# Patient Record
Sex: Male | Born: 1950 | Race: White | Hispanic: No | Marital: Married
Health system: Southern US, Community
[De-identification: ages and names within clinical notes are randomized; demographics above are authoritative.]

## PROBLEM LIST (undated history)

## (undated) DIAGNOSIS — Z9049 Acquired absence of other specified parts of digestive tract: Secondary | ICD-10-CM

## (undated) DIAGNOSIS — A498 Other bacterial infections of unspecified site: Secondary | ICD-10-CM

## (undated) HISTORY — PX: OTHER SURGICAL HISTORY: SHX169

## (undated) HISTORY — DX: Acquired absence of other specified parts of digestive tract: Z90.49

## (undated) HISTORY — PX: GASTRIC BYPASS: SHX52

## (undated) HISTORY — DX: Other bacterial infections of unspecified site: A49.8

---

## 2004-08-03 ENCOUNTER — Ambulatory Visit: Payer: Self-pay | Admitting: Internal Medicine

## 2004-09-27 ENCOUNTER — Ambulatory Visit: Payer: Self-pay | Admitting: Gastroenterology

## 2004-11-17 ENCOUNTER — Ambulatory Visit: Payer: Self-pay | Admitting: Internal Medicine

## 2004-11-29 ENCOUNTER — Ambulatory Visit: Payer: Self-pay | Admitting: Urology

## 2004-12-01 ENCOUNTER — Ambulatory Visit: Payer: Self-pay | Admitting: Urology

## 2009-03-17 DEATH — deceased

## 2010-05-26 ENCOUNTER — Ambulatory Visit: Payer: Self-pay | Admitting: Gastroenterology

## 2012-04-16 ENCOUNTER — Ambulatory Visit: Payer: Self-pay | Admitting: Urology

## 2012-04-22 ENCOUNTER — Ambulatory Visit: Payer: Self-pay | Admitting: Urology

## 2012-04-24 ENCOUNTER — Ambulatory Visit: Payer: Self-pay | Admitting: Urology

## 2012-04-25 ENCOUNTER — Ambulatory Visit: Payer: Self-pay | Admitting: Urology

## 2012-05-16 ENCOUNTER — Ambulatory Visit: Payer: Self-pay | Admitting: Urology

## 2012-05-30 ENCOUNTER — Ambulatory Visit: Payer: Self-pay | Admitting: Urology

## 2012-06-06 ENCOUNTER — Ambulatory Visit: Payer: Self-pay | Admitting: Urology

## 2012-06-10 ENCOUNTER — Ambulatory Visit: Payer: Self-pay | Admitting: Urology

## 2013-09-04 ENCOUNTER — Other Ambulatory Visit: Payer: Self-pay | Admitting: Urology

## 2013-09-04 DIAGNOSIS — N209 Urinary calculus, unspecified: Secondary | ICD-10-CM

## 2014-08-07 NOTE — Op Note (Signed)
PATIENT NAME:  Nathan [name], Nathan [name] MR#:  [id number] DATE OF BIRTH:  1951/02/25  DATE OF PROCEDURE:  04/22/2012  PREOPERATIVE DIAGNOSIS: Bilateral nephrolithiasis.   POSTOPERATIVE DIAGNOSIS: Bilateral nephrolithiasis.   PROCEDURES: 1. Cystoscopy with bilateral stent placement.  2. Fluoroscopy.   SURGEON: Anola GurneyMichael Alexey Rhoads, MD   ANESTHETISTS: Dr.  Dimple Caseyice and Dr. Evelene CroonWolff   ANESTHETIC METHOD: General per Dr. Dimple Caseyice and local per Dr. Evelene CroonWolff.  INDICATIONS: See the dictated history and physical. After informed consent, the patient requests the above procedures.   OPERATIVE SUMMARY: After adequate general anesthesia had been attained, the patient was placed into the dorsal lithotomy position and the perineum was prepped and draped in the usual fashion. Fluoroscopy was performed which revealed bilateral significant stone burden. At this point, the 21-French cystoscope was coupled with the camera and then visually advanced into the bladder. The bladder was thoroughly examined. Both ureteral orifices were identified and had clear efflux. No bladder mucosal lesions were identified. At this point, a 0.035 Glidewire was passed up the right ureter under fluoroscopic guidance. A 6 x 26 cm double pigtail stent was passed up the guidewire and positioned in the ureter. The guidewire and cystoscope were then removed, taking care to leave the stent in position. A decision was then made to pass the stent on the left side as well because of significant stone burden. Therefore, the cystoscope was reintroduced into the bladder and guidewire passed up the left ureter. A 6 x 26 cm stent was also passed on this side and guidewire removed, taking care to leave the stent in position. The bladder was then drained and the scope was removed. Sutures were left attached to both stents; 10 mL of viscous Xylocaine was instilled within the urethra.    The procedure was then terminated, and the patient was transferred to the recovery room in stable  condition.   ____________________________ Suszanne ConnersMichael R. Evelene CroonWolff, MD mrw:cb D: 04/22/2012 11:48:27 ET T: 04/22/2012 12:04:04 ET JOB#: [id number]  cc: Suszanne ConnersMichael R. Evelene CroonWolff, MD, <Dictator> Danella PentonMark F. Miller, MD Orson ApeMICHAEL R Alberto Schoch MD ELECTRONICALLY SIGNED 04/22/2012 19:03

## 2014-08-07 NOTE — H&P (Signed)
PATIENT NAME:  Nathan [name], Nathan [name] MR#:  [id number] DATE OF BIRTH:  [birth year]  DATE OF ADMISSION:  04/22/2012  CHIEF COMPLAINT: Kidney stones.   HISTORY OF PRESENT ILLNESS: The patient is a 64 year old white male with hematuria which was evaluated in the office, and he was found to have bilateral kidney stone disease. He also had a urinalysis with atypia. Specifically, he was found to have 3 stones on the right measuring 5 x 7, 6 x 10 and 15 x 22 mm. He was also found to have multiple stones on the left side measuring 6 x 12, 5 x 5, and 7 x 6 mm. He comes in now for cystoscopy and right stent placement.   ALLERGIES: No drug allergies.   CURRENT MEDICATIONS: Include metformin, amlodipine, simvastatin, sertraline, Bupropion,  temazepam,  potassium chloride ER, omeprazole, benazepril and aspirin.   PAST SURGICAL HISTORY: Include gastric bypass in 2006, lithotripsy in 2007, esophageal dilatation in 2003 and appendectomy in 1967.   SOCIAL HISTORY: He denied tobacco or alcohol use.   FAMILY HISTORY: Remarkable for parents with heart disease, diabetes, renal insufficiency and stroke.   PAST AND CURRENT MEDICAL CONDITIONS:  1. Hypertension.  2. Obesity.  3. Esophageal stricture.  4. GERD.  5. Hyperlipidemia.  6. Diabetes.  7. Sleep apnea.  8. History of colon polyps.  9. Retinopathy.   REVIEW OF SYSTEMS: The patient reports that morphine has caused him to have respiratory failure, but he does not have specific allergic reaction to morphine such as rash or pruritus. He denied chest pain or shortness of breath.   PHYSICAL EXAMINATION: GENERAL: Obese white male in no distress.  HEENT: Sclerae were clear. Pupils were equal, round, and reactive to light and accommodation. Extraocular movements were intact.  NECK: Supple. No palpable cervical adenopathy.  LUNGS: Clear to auscultation.  HEART: Regular rhythm and rate without audible murmurs.  ABDOMEN: Soft, nontender.  GENITOURINARY: Deferred.   RECTAL: Deferred.  NEUROMUSCULAR: Alert and oriented x 3, nonfocal.   IMPRESSION: 1. Bilateral nephrolithiasis with very large stone burden on the right.  2. Hematuria.  3. Atypia on cytology.   PLAN: Cystoscopy with right stent placement.    ____________________________ Suszanne ConnersMichael R. Evelene CroonWolff, MD mrw:cb D: 04/18/2012 10:55:21 ET T: 04/18/2012 11:44:19 ET JOB#: [id number]  cc: Suszanne ConnersMichael R. Evelene CroonWolff, MD, <Dictator> Orson ApeMICHAEL R Stewart Pimenta MD ELECTRONICALLY SIGNED 04/18/2012 12:58

## 2014-08-07 NOTE — H&P (Signed)
PATIENT NAME:  Nathan [name], Nathan [name] MR#:  [id number] DATE OF BIRTH:  26-Feb-1951  DATE OF ADMISSION:  06/10/2012  CHIEF COMPLAINT: Kidney stone.   HISTORY OF PRESENT ILLNESS: Mr. Nathan [name] is a 64 year old white male with large bilateral kidney stones who underwent bilateral stent placement January 14th. He subsequently underwent lithotripsy for all the stones, in both kidneys, and now has passed the vast majority of the stone fragments. He comes in now for stent removal bilaterally.   ALLERGIES: No drug allergies.   CURRENT MEDICATIONS: Include metformin, amlodipine, simvastatin, sertraline, bupropion, temazepam, potassium chloride ER, omeprazole, benazepril and aspirin.   PAST SURGICAL HISTORY:  1.  Gastric bypass surgery in 2006. 2.  Lithotripsy in 2007. 3.  Esophageal dilatation in 2003. 4.  Appendectomy in 1967.   SOCIAL HISTORY: The patient denied tobacco or alcohol use.   FAMILY HISTORY: Remarkable for heart disease, diabetes, renal insufficiency and stroke.   PAST AND CURRENT MEDICAL CONDITIONS: 1.   Hypertension. 2.   Obesity. 3.   Esophageal stricture.  4.   Gastroesophageal reflux disease.  5.   Hyperlipidemia.  6.   Diabetes.  7.   Sleep apnea.  8.   History of colon polyps. 9.   Retinopathy. 10. Kidney stones.   REVIEW OF SYSTEMS: The patient reports that morphine causes him to have respiratory failure, but he has not had any allergic reactions, such as rash, to morphine. He denied chest pain or shortness of breath.   PHYSICAL EXAMINATION:  GENERAL: A morbidly obese white male in no distress.  HEENT: Sclerae were clear. Pupils were equally round and reactive to light and accommodation. Extraocular movements were intact.  NECK: Was supple. No palpable cervical adenopathy. No audible carotid bruits.  LUNGS: Clear to auscultation.  CARDIOVASCULAR: Regular rhythm and rate without audible murmurs.  ABDOMEN: Soft, nontender abdomen.  GENITOURINARY/RECTAL: Deferred.   NEUROMUSCULAR: Alert and oriented x 3. Nonfocal.   IMPRESSION: Bilateral nephrolithiasis.   PLAN: Cystoscopy with bilateral stent removal.  ____________________________ Suszanne ConnersMichael R. Evelene CroonWolff, MD mrw:sb D: 06/06/2012 12:52:05 ET T: 06/06/2012 13:30:44 ET JOB#: [id number]  cc: Suszanne ConnersMichael R. Evelene CroonWolff, MD, <Dictator> Orson ApeMICHAEL R Jabar Krysiak MD ELECTRONICALLY SIGNED 06/06/2012 16:11

## 2014-08-07 NOTE — Op Note (Signed)
PATIENT NAME:  Christopherson, Sumner W MR#:  [id number] DATE OF BIRTH:  08-02-1950  DATE OF PROCEDURE:  06/10/2012  PREOPERATIVRuthell Rummage DIAGNOSIS: Bilateral nephrolithiasis.   POSTOPERATIVE DIAGNOSIS: Bilateral nephrolithiasis.   PROCEDURE: Cystoscopy, with stent removal.   SURGEON: Evelene CroonWolff.   ANESTHETIST: Rice.   ANESTHETIC METHOD: General, per Rice, and local, per Evelene CroonWolff.  INDICATIONS: See the dictated history and physical.   After informed consent, the patient requests the above procedure.   OPERATIVE SUMMARY: After adequate general anesthesia had been obtained, the patient was placed into dorsal lithotomy position and the perineum was prepped and draped in the usual fashion. The 21-French cystoscope was coupled with the camera and visually advanced into the urethra. The sutures attached to the stents were identified in the distal urethra. Sutures were engaged with the alligator forceps and the stents were removed.   At this point, the cystoscope was advanced into the bladder. The bladder was thoroughly inspected. No bladder lesions were identified. The bladder was moderately trabeculated. Both orifices were identified and had clear efflux. At this point, the bladder was drained and the cystoscope was removed; 10 mL of viscous Xylocaine was instilled within the urethra and the bladder. A B and O suppository was placed.   The procedure was then terminated and the patient was transferred to the recovery room in stable condition.     ____________________________ Suszanne ConnersMichael R. Evelene CroonWolff, MD mrw:dm D: 06/10/2012 09:40:00 ET T: 06/10/2012 10:47:56 ET JOB#: [id number]  cc: Suszanne ConnersMichael R. Evelene CroonWolff, MD, <Dictator> Orson ApeMICHAEL R Ankita Newcomer MD ELECTRONICALLY SIGNED 06/10/2012 13:35

## 2016-03-08 ENCOUNTER — Other Ambulatory Visit: Payer: Self-pay | Admitting: Internal Medicine

## 2016-03-08 ENCOUNTER — Ambulatory Visit
Admission: RE | Admit: 2016-03-08 | Discharge: 2016-03-08 | Disposition: A | Discharge status: Home / Self Care | Payer: Medicare Other | Source: Ambulatory Visit | Attending: Internal Medicine | Admitting: Internal Medicine

## 2016-03-08 DIAGNOSIS — I2510 Atherosclerotic heart disease of native coronary artery without angina pectoris: Secondary | ICD-10-CM | POA: Diagnosis not present

## 2016-03-08 DIAGNOSIS — N400 Enlarged prostate without lower urinary tract symptoms: Secondary | ICD-10-CM | POA: Insufficient documentation

## 2016-03-08 DIAGNOSIS — N200 Calculus of kidney: Secondary | ICD-10-CM | POA: Diagnosis not present

## 2016-03-08 DIAGNOSIS — Z9884 Bariatric surgery status: Secondary | ICD-10-CM | POA: Diagnosis not present

## 2016-03-08 DIAGNOSIS — I700 Atherosclerosis of aorta: Secondary | ICD-10-CM | POA: Insufficient documentation

## 2016-03-08 DIAGNOSIS — R1084 Generalized abdominal pain: Secondary | ICD-10-CM | POA: Diagnosis present

## 2016-03-08 DIAGNOSIS — K8020 Calculus of gallbladder without cholecystitis without obstruction: Secondary | ICD-10-CM | POA: Diagnosis not present

## 2016-10-06 ENCOUNTER — Emergency Department
Admission: EM | Admit: 2016-10-06 | Discharge: 2016-10-06 | Disposition: A | Discharge status: Home / Self Care | Payer: Medicare Other | Attending: Student in an Organized Health Care Education/Training Program | Admitting: Student in an Organized Health Care Education/Training Program

## 2016-10-06 ENCOUNTER — Emergency Department: Payer: Medicare Other

## 2016-10-06 DIAGNOSIS — T82598A Other mechanical complication of other cardiac and vascular devices and implants, initial encounter: Secondary | ICD-10-CM | POA: Insufficient documentation

## 2016-10-06 DIAGNOSIS — T82528A Displacement of other cardiac and vascular devices and implants, initial encounter: Secondary | ICD-10-CM

## 2016-10-06 DIAGNOSIS — Y713 Surgical instruments, materials and cardiovascular devices (including sutures) associated with adverse incidents: Secondary | ICD-10-CM | POA: Insufficient documentation

## 2016-10-06 DIAGNOSIS — T82524A Displacement of infusion catheter, initial encounter: Secondary | ICD-10-CM

## 2016-10-06 NOTE — Progress Notes (Signed)
Peripherally Inserted Central Catheter/Midline Placement  The IV Nurse has discussed with the patient and/or persons authorized to consent for the patient, the purpose of this procedure and the potential benefits and risks involved with this procedure.  The benefits include less needle sticks, lab draws from the catheter, and the patient may be discharged home with the catheter. Risks include, but not limited to, infection, bleeding, blood clot (thrombus formation), and puncture of an artery; nerve damage and irregular heartbeat and possibility to perform a PICC exchange if needed/ordered by physician.  Alternatives to this procedure were also discussed.  Bard Power PICC patient education guide, fact sheet on infection prevention and patient information card has been provided to patient /or left at bedside.    PICC/Midline Placement Documentation  PICC Double Lumen 10/06/16 PICC Left Basilic 50 cm 0 cm (Active)  Indication for Insertion or Continuance of Line Home intravenous therapies (PICC only) 10/06/2016  6:35 PM  Exposed Catheter (cm) 0 cm 10/06/2016  6:35 PM  Site Assessment Clean;Dry;Intact 10/06/2016  6:35 PM  Lumen #1 Status Blood return noted;Flushed;Heparin locked (Peds) 10/06/2016  6:35 PM  Lumen #2 Status Blood return noted;Flushed;Heparin locked (Peds) 10/06/2016  6:35 PM  Dressing Type Transparent 10/06/2016  6:35 PM  Dressing Status Clean;Dry;Intact;Antimicrobial disc in place 10/06/2016  6:35 PM  Dressing Intervention New dressing 10/06/2016  6:35 PM  Dressing Change Due 10/13/16 10/06/2016  6:35 PM       Christeen Douglashomas, Guynell Kleiber L 10/06/2016, 6:53 PM

## 2016-10-06 NOTE — ED Triage Notes (Signed)
Pt states he wrapped his picc line with plastic wrap to take a shower and noticed that it was not in the same position after and needs placement to be verified. PICC line in the left arm.

## 2016-10-06 NOTE — ED Notes (Addendum)
Pt states that he is getting IV antibiotics at home, when pt got out of the shower today he noticed that his PICC line looked longer than normal, [facility] and was advised to come to ED for evaluation  Pt denies any other complaints at this time.

## 2016-10-06 NOTE — ED Provider Notes (Signed)
[facility] Emergency Department Provider Note    None    (approximate)  I have reviewed the triage vital signs and the nursing notes.   HISTORY  Chief Complaint Vascular Access Problem    HPI Nathan [name] is a 66 y.o. 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 Patient went to take a shower today and wrapped his PICC line and plastic. When he got out of the shower he noted that it been removed roughly 6 inches. There is still roughly 15 cm in the vein. No bleeding. Otherwise a symptomatically. Patient told to come to the ER for replacement by his home health nurse.   No past medical history on file. No family history on file. No past surgical history on file. There are no active problems to display for this patient.     Prior to Admission medications   Not on File    Allergies Patient has no known allergies.    Social History Social History  Substance Use Topics  . Smoking status: Not on file  . Smokeless tobacco: Not on file  . Alcohol use Not on file    Review of Systems Patient denies headaches, rhinorrhea, blurry vision, numbness, shortness of breath, chest pain, edema, cough, abdominal pain, nausea, vomiting, diarrhea, dysuria, fevers, rashes or hallucinations unless otherwise stated above in HPI. ____________________________________________   PHYSICAL EXAM:  VITAL SIGNS: Vitals:   10/06/16 1612 10/06/16 1842  BP: 122/79 135/71  Pulse: 76 66  Resp: 16 16  Temp: 98.3 F (36.8 C)     Constitutional: Alert and oriented. Well appearing and in no acute distress. Eyes: Conjunctivae are normal.  Head: Atraumatic. Nose: No congestion/rhinnorhea. Mouth/Throat: Mucous membranes are moist.   Neck: No stridor. Painless ROM.  Cardiovascular: Normal rate, regular rhythm. Grossly normal heart sounds.  Good  peripheral circulation. Respiratory: Normal respiratory effort.  No retractions. Lungs CTAB. Gastrointestinal: Soft and nontender. No distention. No abdominal bruits. No CVA tenderness. Musculoskeletal: No lower extremity tenderness nor edema.  No joint effusions.  Left axillary PICC pulled out with roughly 15cm remaining. Neurologic:  Normal speech and language. No gross focal neurologic deficits are appreciated. No facial droop Skin:  Skin is warm, dry and intact. No rash noted. Psychiatric: Mood and affect are normal. Speech and behavior are normal.  ____________________________________________   LABS (all labs ordered are listed, but only abnormal results are displayed)  No results found for this or any previous visit (from the past 24 hour(s)). ____________________________________________ ____________________________________________  RADIOLOGY  I personally reviewed all radiographic images ordered to evaluate for the above acute complaints and reviewed radiology reports and findings.  These findings were personally discussed with the patient.  Please see medical record for radiology report.  ____________________________________________   PROCEDURES  Procedure(s) performed:  Procedures    Critical Care performed: no ____________________________________________   INITIAL IMPRESSION / ASSESSMENT AND PLAN / ED COURSE  Pertinent labs & imaging results that were available during my care of the patient were reviewed by me and considered in my medical decision making (see chart for details).  DDX: picc line displacement, line fracture, contusion  Nathan [name] is a 66 y.o. who presents to the ED with displaced PICC line as described above. Patient otherwise well-appearing without any evidence of acute medical emergency. PICC line exchanged. Patient tolerated procedure well. Patient stable for continued outpatient  follow-up.       ____________________________________________   FINAL CLINICAL IMPRESSION(S) / ED DIAGNOSES  Final diagnoses:  Displacement of peripherally inserted central venous catheter (PICC) (HCC)      NEW MEDICATIONS STARTED DURING THIS VISIT:  There are no discharge medications for this patient.    Note:  This document was prepared using Dragon voice recognition software and may include unintentional dictation errors.     Willy Eddy, MD 10/06/16 [phone]

## 2017-10-29 ENCOUNTER — Other Ambulatory Visit: Payer: Self-pay | Admitting: Neurology

## 2017-10-29 ENCOUNTER — Ambulatory Visit: Payer: Medicare Other

## 2017-10-29 DIAGNOSIS — R2689 Other abnormalities of gait and mobility: Secondary | ICD-10-CM

## 2017-11-06 ENCOUNTER — Ambulatory Visit: Payer: Medicare Other

## 2017-11-06 ENCOUNTER — Ambulatory Visit
Admission: RE | Admit: 2017-11-06 | Discharge: 2017-11-06 | Disposition: A | Discharge status: Home / Self Care | Payer: Medicare Other | Source: Ambulatory Visit | Attending: Neurology | Admitting: Neurology

## 2017-11-06 ENCOUNTER — Other Ambulatory Visit: Payer: Medicare Other

## 2017-11-06 DIAGNOSIS — R2689 Other abnormalities of gait and mobility: Secondary | ICD-10-CM

## 2017-11-06 DIAGNOSIS — R29898 Other symptoms and signs involving the musculoskeletal system: Secondary | ICD-10-CM | POA: Insufficient documentation

## 2017-11-08 ENCOUNTER — Ambulatory Visit: Payer: Medicare Other | Admitting: Physical Therapy

## 2017-11-13 ENCOUNTER — Ambulatory Visit: Payer: Medicare Other

## 2017-11-15 ENCOUNTER — Ambulatory Visit: Payer: Medicare Other

## 2017-11-19 ENCOUNTER — Ambulatory Visit: Payer: Medicare Other

## 2017-11-21 ENCOUNTER — Ambulatory Visit: Payer: Medicare Other

## 2017-11-26 ENCOUNTER — Ambulatory Visit: Payer: Medicare Other

## 2017-11-28 ENCOUNTER — Ambulatory Visit: Payer: Medicare Other

## 2017-12-03 ENCOUNTER — Ambulatory Visit: Payer: Medicare Other

## 2017-12-05 ENCOUNTER — Ambulatory Visit: Payer: Medicare Other

## 2017-12-10 ENCOUNTER — Ambulatory Visit: Payer: Medicare Other | Attending: Internal Medicine

## 2017-12-10 ENCOUNTER — Ambulatory Visit: Payer: Medicare Other

## 2017-12-10 ENCOUNTER — Other Ambulatory Visit: Payer: Self-pay

## 2017-12-10 DIAGNOSIS — R2689 Other abnormalities of gait and mobility: Secondary | ICD-10-CM | POA: Diagnosis present

## 2017-12-10 DIAGNOSIS — M6281 Muscle weakness (generalized): Secondary | ICD-10-CM | POA: Diagnosis not present

## 2017-12-10 NOTE — Therapy (Signed)
[facility] MAIN [facility] SERVICES [address] [place], [place], [address] Phone: [phone]   Fax:  [phone]  Physical Therapy Evaluation  Patient Details  Name: Nathan [name] MRN: [id number] Date of Birth: 05-28-1950 No data recorded  Encounter Date: 12/10/2017  PT End of Session - 12/10/17 1807    Visit Number  1    Number of Visits  16    Date for PT Re-Evaluation  02/04/18    Authorization Type  1/10  start date 12/10/17     PT Start Time  1400    PT Stop Time  1459    PT Time Calculation (min)  59 min    Equipment Utilized During Treatment  Gait belt    Activity Tolerance  Patient tolerated treatment well    Behavior During Therapy  [facility] for tasks assessed/performed       Past Medical History:  Diagnosis Date  . E. coli infection   . History of appendectomy     Past Surgical History:  Procedure Laterality Date  . cardiac catherization    . GASTRIC BYPASS    . upper blepharoplasty      There were no vitals filed for this visit.         OUTCOME MEASURES: TEST Outcome Interpretation  5 times sit<>stand 15 sec with one LOB  >67 yo, >15 sec indicates increased risk for falls  10 meter walk test 12 seconds      =.83          m/s <1.0 m/s indicates increased risk for falls; limited community ambulator  DGI 11/24 Predictive of falls       LEFS 27/80              Treat Access Code:  URL: [email or web address]  Date: 12/10/2017  Prepared by: Precious Bard   Exercises  Seated Eccentric Ankle Plantar Flexion with Resistance - 10 reps - 1 sets - 5 hold - 1x daily - 7x weekly  Seated Heel Toe Raises - 10 reps - 2 sets - 5 hold - 1x daily - 7x weekly  Standing March with Unilateral Counter Support - 10 reps - 2 sets - 5 hold - 1x daily - 7x weekly    [facility] PT Assessment - 12/10/17 0001      Assessment   Medical Diagnosis  BLE weakness    Onset Date/Surgical Date  04/11/17    Hand  Dominance  Right    Prior Therapy  not for this issue      Precautions   Precautions  ICD/Pacemaker   E-coli      Restrictions   Weight Bearing Restrictions  No      Balance Screen   Has the patient fallen in the past 6 months  Yes    How many times?  1-2 falls a week     Has the patient had a decrease in activity level because of a fear of falling?   Yes    Is the patient reluctant to leave their home because of a fear of falling?   Yes      Home Environment   Living Environment  Private residence    Living Arrangements  Spouse/significant other    Available Help at Discharge  Family    Type of Home  House    Home Access  Stairs to enter    Entrance Stairs-Number of Steps  3    Entrance Stairs-Rails  None  Home Layout  Able to live on main level with bedroom/bathroom    Home Equipment  Walker - standard;Cane - single point;Grab bars - toilet;Grab bars - tub/shower      Prior Function   Level of Independence  Independent    Vocation  Retired    Leisure  likes to go watch football and basketball games,       Cognition   Overall Cognitive Status  Within Functional Limits for tasks assessed      Observation/Other Assessments   Observations  discoloration of ankles, mild edema of bilateral ankles.       Observation/Other Assessments-Edema    Edema  --   slight pitting edema      Sensation   Light Touch  Appears Intact    Hot/Cold  Impaired by gross assessment   impaired per patient report     Coordination   Gross Motor Movements are Fluid and Coordinated  Yes    Finger Nose Finger Test  fluid and coordinated    Heel Shin Test  fluid and coordinated      Posture/Postural Control   Posture/Postural Control  Postural limitations    Postural Limitations  Rounded Shoulders;Forward head;Flexed trunk      ROM / Strength   AROM / PROM / Strength  Strength      Strength   Overall Strength  Deficits    Strength Assessment Site  Hip;Knee;Ankle    Right/Left Hip   Right;Left    Right Hip Flexion  4-/5    Right Hip Extension  3-/5    Right Hip ABduction  4-/5    Right Hip ADduction  2+/5    Left Hip Flexion  4-/5    Left Hip Extension  3-/5    Left Hip ABduction  4-/5    Left Hip ADduction  3-/5    Right/Left Knee  Right;Left    Right Knee Flexion  4-/5    Right Knee Extension  4-/5    Left Knee Flexion  4-/5    Left Knee Extension  4-/5    Right/Left Ankle  Right;Left    Right Ankle Dorsiflexion  2/5    Right Ankle Plantar Flexion  2/5    Left Ankle Dorsiflexion  2+/5    Left Ankle Plantar Flexion  2/5      Flexibility   Soft Tissue Assessment /Muscle Length  yes    Hamstrings  slight limitation       Bed Mobility   Bed Mobility  Supine to Sit;Sit to Supine    Supine to Sit  Independent    Sit to Supine  Independent      Transfers   Transfers  Sit to Stand;Stand to Sit    Sit to Stand  6: Modified independent (Device/Increase time)    Five time sit to stand comments   15 seconds    Stand to Sit  6: Modified independent (Device/Increase time)    Comments  hands on knees       Ambulation/Gait   Ambulation/Gait  Yes    Ambulation/Gait Assistance  6: Modified independent (Device/Increase time)    Ambulation Distance (Feet)  30 Feet    Assistive device  None    Gait Pattern  Right foot flat;Left foot flat;Narrow base of support;Poor foot clearance - left;Poor foot clearance - right    Ambulation Surface  Level;Indoor    Gait velocity  0.83 m/s    Stairs  Yes    Stairs Assistance  5: Supervision    Stair Management Technique  Two rails;Alternating pattern;Forwards    Number of Stairs  4      Standardized Balance Assessment   Standardized Balance Assessment  Dynamic Gait Index      Dynamic Gait Index   Level Surface  Mild Impairment    Change in Gait Speed  Moderate Impairment    Gait with Horizontal Head Turns  Moderate Impairment    Gait with Vertical Head Turns  Moderate Impairment    Gait and Pivot Turn  Moderate  Impairment    Step Over Obstacle  Moderate Impairment    Step Around Obstacles  Mild Impairment    Steps  Mild Impairment    Total Score  11                Objective measurements completed on examination: See above findings.              PT Education - 12/10/17 1806    Education Details  goals, POC, HEP     Person(s) Educated  Patient;Spouse    Methods  Explanation;Demonstration;Handout    Comprehension  Verbalized understanding;Returned demonstration;Need further instruction       PT Short Term Goals - 12/10/17 1811      PT SHORT TERM GOAL #1   Title  Patient will be independent in home exercise program to improve strength/mobility for better functional independence with ADLs.    Baseline  HEP given     Time  2    Period  Weeks    Status  New    Target Date  12/24/17      PT SHORT TERM GOAL #2   Title  Patient will deny any falls over past 2 weeks to demonstrate improved safety awareness at home and work.     Baseline  falls 1-2x/week     Time  2    Period  Weeks    Status  New    Target Date  12/24/17        PT Long Term Goals - 12/10/17 1813      PT LONG TERM GOAL #1   Title  Patient will deny any falls over past 4 weeks to demonstrate improved safety awareness at home and work.     Baseline  falls 1-2x/week     Time  8    Period  Weeks    Status  New    Target Date  02/04/18      PT LONG TERM GOAL #2   Title  Patient will increase dynamic gait index score to >19/24 as to demonstrate reduced fall risk and improved dynamic gait balance for better safety with community/home ambulation.     Baseline  8/26: 11/24    Time  8    Period  Weeks    Status  New    Target Date  02/04/18      PT LONG TERM GOAL #3   Title  Patient will increase BLE gross strength to 4+/5 as to improve functional strength for independent gait, increased standing tolerance and increased ADL ability.    Baseline  8/26" unable to perform standing heel raise    Time  8     Period  Weeks    Status  New    Target Date  02/04/18      PT LONG TERM GOAL #4   Title  Patient will increase lower extremity functional scale to >50/80 to demonstrate improved functional mobility  and increased tolerance with ADLs.     Baseline  8/26: 27/80     Time  8    Period  Weeks    Status  New    Target Date  02/04/18             Plan - 12/10/17 1808    Clinical Impression Statement  Patient is a pleasant 67 year old male who presents for bilateral LE weakness and instability with gait. Patient has noted weakness of bilateral ankles with inability to perform calf raise in standing, however is able to perform in seated position. Foot slap and limited foot clearance bilaterally with narrow BOS is noted with ambulation with patient having resultant LOB when turning L or R.  10 MWT performed with .83 m/s and DGI 11/24 indicating difficulty with dynamic stability. Patient will benefit from skilled physical therapy to improve strength, stability, and mobility to decrease risk of falling with ambulation.     History and Personal Factors relevant to plan of care:  This patient presents with 1- 2,  personal factors/ comorbidities and  4  body elements including body structures and functions, activity limitations and or participation restrictions. patient's condition is evolving,    Clinical Presentation  Evolving    Clinical Presentation due to:  progressively worsening, has unstable diabetes and occasional postural hypotension     Clinical Decision Making  Moderate    Rehab Potential  Fair    Clinical Impairments Affecting Rehab Potential  (+) family support, age, (-) chronic E choli     PT Frequency  2x / week    PT Duration  8 weeks    PT Treatment/Interventions  ADLs/Self Care Home Management;Aquatic Therapy;Cryotherapy;Ultrasound;Traction;Moist Heat;Electrical Stimulation;DME Instruction;Gait training;Stair training;Functional mobility training;Neuromuscular re-education;Balance  training;Therapeutic exercise;Therapeutic activities;Patient/family education;Orthotic Fit/Training;Manual techniques;Dry needling;Passive range of motion;Compression bandaging;Manual lymph drainage;Energy conservation;Splinting;Taping;Vasopneumatic Device;Visual/perceptual remediation/compensation;Vestibular    PT Next Visit Plan  review HEP, ankle strengthen and stability     PT Home Exercise Plan  see sheet    Recommended Other Services  n/a    Consulted and Agree with Plan of Care  Patient;Family member/caregiver    Family Member Consulted  wife       Patient will benefit from skilled therapeutic intervention in order to improve the following deficits and impairments:  Abnormal gait, Cardiopulmonary status limiting activity, Decreased activity tolerance, Decreased balance, Decreased knowledge of precautions, Decreased endurance, Decreased mobility, Difficulty walking, Decreased strength, Increased edema, Impaired flexibility, Impaired perceived functional ability, Postural dysfunction, Improper body mechanics  Visit Diagnosis: Muscle weakness (generalized)  Other abnormalities of gait and mobility     Problem List There are no active problems to display for this patient.  Precious Bard, PT, DPT   12/10/2017, 6:16 PM  [facility] MAIN [facility] SERVICES [address] [place], [place], [address] Phone: [phone]   Fax:  [phone]  Name: SHIVAAY STORMONT MRN: [id number] Date of Birth: 1951/03/04

## 2017-12-10 NOTE — Patient Instructions (Signed)
Access Code: [email or web address]  URL: [email or web address]  Date: 12/10/2017  Prepared by: Precious BardMarina Oriyah Lamphear   Exercises  Seated Eccentric Ankle Plantar Flexion with Resistance - 10 reps - 1 sets - 5 hold - 1x daily - 7x weekly  Seated Heel Toe Raises - 10 reps - 2 sets - 5 hold - 1x daily - 7x weekly  Standing March with Unilateral Counter Support - 10 reps - 2 sets - 5 hold - 1x daily - 7x weekly

## 2017-12-12 ENCOUNTER — Ambulatory Visit: Payer: Medicare Other

## 2017-12-24 ENCOUNTER — Ambulatory Visit: Payer: Medicare Other | Attending: Internal Medicine

## 2017-12-24 DIAGNOSIS — M6281 Muscle weakness (generalized): Secondary | ICD-10-CM | POA: Diagnosis present

## 2017-12-24 DIAGNOSIS — R2689 Other abnormalities of gait and mobility: Secondary | ICD-10-CM

## 2017-12-24 NOTE — Therapy (Signed)
[facility] MAIN [facility] SERVICES [address] [place], [place], [address] Phone: [phone]   Fax:  [phone]  Physical Therapy Treatment  Patient Details  Name: Nathan [name] MRN: [id number] Date of Birth: 10/26/1950 No data recorded  Encounter Date: 12/24/2017  PT End of Session - 12/24/17 1446    Visit Number  2    Number of Visits  16    Date for PT Re-Evaluation  02/04/18    Authorization Type  2/10  start date 12/10/17     PT Start Time  1347    PT Stop Time  1432    PT Time Calculation (min)  45 min    Equipment Utilized During Treatment  Gait belt    Activity Tolerance  Patient tolerated treatment well    Behavior During Therapy  [facility] for tasks assessed/performed       Past Medical History:  Diagnosis Date  . E. coli infection   . History of appendectomy     Past Surgical History:  Procedure Laterality Date  . cardiac catherization    . GASTRIC BYPASS    . upper blepharoplasty      There were no vitals filed for this visit.  Subjective Assessment - 12/24/17 1433    Subjective  Patient presents compliant with HEP, except for df exercise which he states he" can't do", Patient reports he has an upcoming trip to [place] soon.     Patient is accompained by:  Family member    Pertinent History  Nathan [name] is a right handed 67 y.o. male retiree with history of diabetes, hypertension , here for evaluation of lower extremity weakness. Patient reports weakness in legs began in December last year. 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 No headaches, no dizziness, no numbness of his legs, no back problems, has never had back surgery. Only recent thing is going on nitrofurantoin twice daily for persistent UTI. Looking for CIDV.     Limitations  Standing;Walking;House hold activities;Other (comment)    How long can you sit comfortably?  n/a    How long can you stand comfortably?  4-5 minutes    How  long can you walk comfortably?  challenging with inclines and stopping.     Diagnostic tests  MRI for cervical spine    Patient Stated Goals  want to walk safer, stop without falling.     Currently in Pain?  No/denies       HEP review Exercises   Seated Eccentric Ankle Plantar Flexion with Resistance - 10 reps - 1 sets - 5 hold - 1x daily - 7x weekly   Seated Heel Toe Raises - 10 reps - 2 sets - 5 hold - 1x daily - 7x weekly  Standing March with Unilateral Counter Support - 10 reps - 2 sets - 5 hold - 1x daily - 7x weekly   Treat: Airex pad: static stand no UE support; 3x 30 seconds, frequent posterior LOB with limited ankle stability initially Airex pad: horizontal head turns 3x30 seconds; cues for only turning head  vertical head turns 3x 30 seconds; frequent LOB in both directions Balloon taps reaching inside and outside BOS with CGA; 3 minutes  Side stepping in // bars 4x length of bars  Seated:   RTB df 15x , 2 sets  Hip adduction squeezes 10x 2 second holds   TrA activation 10x 3 seconds   step forward and back clap and  return back 10x each leg ; verbal and visual cueing for widening BOS   Patient requires mod-max cueing for task orientation throughout session.  Pt. response to medical necessity: Patient will benefit from skilled physical therapy to improve strength, stability, and mobility to decrease risk of falling with ambulation.                        PT Education - 12/24/17 1445    Education Details  exercise technique, stability     Person(s) Educated  Patient    Methods  Explanation;Demonstration;Verbal cues;Tactile cues    Comprehension  Verbalized understanding;Returned demonstration;Verbal cues required;Need further instruction       PT Short Term Goals - 12/10/17 1811      PT SHORT TERM GOAL #1   Title  Patient will be independent in home exercise program to improve strength/mobility for better functional independence with ADLs.     Baseline  HEP given     Time  2    Period  Weeks    Status  New    Target Date  12/24/17      PT SHORT TERM GOAL #2   Title  Patient will deny any falls over past 2 weeks to demonstrate improved safety awareness at home and work.     Baseline  falls 1-2x/week     Time  2    Period  Weeks    Status  New    Target Date  12/24/17        PT Long Term Goals - 12/10/17 1813      PT LONG TERM GOAL #1   Title  Patient will deny any falls over past 4 weeks to demonstrate improved safety awareness at home and work.     Baseline  falls 1-2x/week     Time  8    Period  Weeks    Status  New    Target Date  02/04/18      PT LONG TERM GOAL #2   Title  Patient will increase dynamic gait index score to >19/24 as to demonstrate reduced fall risk and improved dynamic gait balance for better safety with community/home ambulation.     Baseline  8/26: 11/24    Time  8    Period  Weeks    Status  New    Target Date  02/04/18      PT LONG TERM GOAL #3   Title  Patient will increase BLE gross strength to 4+/5 as to improve functional strength for independent gait, increased standing tolerance and increased ADL ability.    Baseline  8/26" unable to perform standing heel raise    Time  8    Period  Weeks    Status  New    Target Date  02/04/18      PT LONG TERM GOAL #4   Title  Patient will increase lower extremity functional scale to >50/80 to demonstrate improved functional mobility and increased tolerance with ADLs.     Baseline  8/26: 27/80     Time  8    Period  Weeks    Status  New    Target Date  02/04/18            Plan - 12/24/17 1447    Clinical Impression Statement  Patient demonstrates limited ankle righting reactions that improved with prolonged intervention task time due to increasing BOS and increasing focus on task. Patient struggles with  task orientation frequently requiring max cueing for return to task. Patient will benefit from skilled physical therapy to improve  strength, stability, and mobility to decrease risk of falling with ambulation.     Rehab Potential  Fair    Clinical Impairments Affecting Rehab Potential  (+) family support, age, (-) chronic E choli     PT Frequency  2x / week    PT Duration  8 weeks    PT Treatment/Interventions  ADLs/Self Care Home Management;Aquatic Therapy;Cryotherapy;Ultrasound;Traction;Moist Heat;Electrical Stimulation;DME Instruction;Gait training;Stair training;Functional mobility training;Neuromuscular re-education;Balance training;Therapeutic exercise;Therapeutic activities;Patient/family education;Orthotic Fit/Training;Manual techniques;Dry needling;Passive range of motion;Compression bandaging;Manual lymph drainage;Energy conservation;Splinting;Taping;Vasopneumatic Device;Visual/perceptual remediation/compensation;Vestibular    PT Next Visit Plan  review HEP, ankle strengthen and stability     PT Home Exercise Plan  see sheet    Consulted and Agree with Plan of Care  Patient;Family member/caregiver    Family Member Consulted  wife       Patient will benefit from skilled therapeutic intervention in order to improve the following deficits and impairments:  Abnormal gait, Cardiopulmonary status limiting activity, Decreased activity tolerance, Decreased balance, Decreased knowledge of precautions, Decreased endurance, Decreased mobility, Difficulty walking, Decreased strength, Increased edema, Impaired flexibility, Impaired perceived functional ability, Postural dysfunction, Improper body mechanics  Visit Diagnosis: Muscle weakness (generalized)  Other abnormalities of gait and mobility     Problem List There are no active problems to display for this patient.  Precious Bard, PT, DPT   12/24/2017, 2:48 PM  [place] [facility] MAIN [facility] SERVICES [address] [place], [place], [address] Phone: [phone]   Fax:  [phone]  Name: WAQAS BRUHL MRN: [id number] Date of Birth:  [birth year]

## 2017-12-31 ENCOUNTER — Ambulatory Visit: Payer: Medicare Other

## 2017-12-31 DIAGNOSIS — R2689 Other abnormalities of gait and mobility: Secondary | ICD-10-CM

## 2017-12-31 DIAGNOSIS — M6281 Muscle weakness (generalized): Secondary | ICD-10-CM | POA: Diagnosis not present

## 2017-12-31 NOTE — Therapy (Signed)
[place] [facility] MAIN [facility] SERVICES [address] [place], [place], [address] Phone: [phone]   Fax:  [phone]  Physical Therapy Treatment  Patient Details  Name: Nathan [name] MRN: [id number] Date of Birth: [birth year] No data recorded  Encounter Date: 12/31/2017  PT End of Session - 12/31/17 1537    Visit Number  3    Number of Visits  16    Date for PT Re-Evaluation  02/04/18    Authorization Type  3/10  start date 12/10/17     PT Start Time  1516    PT Stop Time  1600    PT Time Calculation (min)  44 min    Equipment Utilized During Treatment  Gait belt    Activity Tolerance  Patient tolerated treatment well    Behavior During Therapy  [facility] for tasks assessed/performed       Past Medical History:  Diagnosis Date  . E. coli infection   . History of appendectomy     Past Surgical History:  Procedure Laterality Date  . cardiac catherization    . GASTRIC BYPASS    . upper blepharoplasty      There were no vitals filed for this visit.  Subjective Assessment - 12/31/17 1520    Subjective  Patient reports being sore after last session. Was not compliant for HEP; only did it 3 x since last session. Reports he has to go [place] for a rental car.     Patient is accompained by:  Family member    Pertinent History  Nathan [name] is a right handed 67 y.o. male retiree with history of diabetes, hypertension , here for evaluation of lower extremity weakness. Patient reports weakness in legs began in December last year. 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 No headaches, no dizziness, no numbness of his legs, no back problems, has never had back surgery. Only recent thing is going on nitrofurantoin twice daily for persistent UTI. Looking for CIDV.     Limitations  Standing;Walking;House hold activities;Other (comment)    How long can you sit comfortably?  n/a    How long can you stand comfortably?  4-5  minutes    How long can you walk comfortably?  challenging with inclines and stopping.     Diagnostic tests  MRI for cervical spine    Patient Stated Goals  want to walk safer, stop without falling.     Currently in Pain?  No/denies          Nustep lvl 3 4 minutes RPM >60 for cardiovascular.     Treat: Airex pad: static stand no UE support; 1x 60 seconds, frequent posterior LOB with limited ankle stability initially Airex pad: horizontal head turns 3x30 seconds; cues for only turning head  vertical head turns 3x 30 seconds; frequent LOB in both directions Balloon taps reaching inside and outside BOS with CGA; 3 minutes  Side stepping in // bars 4x length of bars  Seated:             rockerboard 20x df              Hip adduction squeezes 12x 3 second holds              TrA activation 10x 3 seconds   Df 15x    Sit to stand: 10x arms crossed; 3 second holds at top; frequent forward and backwards LOB. Chair in front of patient to  allow patient to feel more stable/ 2 sets rest between each set and between each 5x.    Patient requires mod-max cueing for task orientation throughout session.   Pt. response to medical necessity: Patient will benefit from skilled physical therapy to improve strength, stability, and mobility to decrease risk of falling with ambulation.                   PT Education - 12/31/17 1537    Education Details  exercise technique, stability    Person(s) Educated  Patient    Methods  Explanation;Demonstration;Verbal cues    Comprehension  Verbalized understanding;Returned demonstration       PT Short Term Goals - 12/10/17 1811      PT SHORT TERM GOAL #1   Title  Patient will be independent in home exercise program to improve strength/mobility for better functional independence with ADLs.    Baseline  HEP given     Time  2    Period  Weeks    Status  New    Target Date  12/24/17      PT SHORT TERM GOAL #2   Title  Patient will deny any  falls over past 2 weeks to demonstrate improved safety awareness at home and work.     Baseline  falls 1-2x/week     Time  2    Period  Weeks    Status  New    Target Date  12/24/17        PT Long Term Goals - 12/10/17 1813      PT LONG TERM GOAL #1   Title  Patient will deny any falls over past 4 weeks to demonstrate improved safety awareness at home and work.     Baseline  falls 1-2x/week     Time  8    Period  Weeks    Status  New    Target Date  02/04/18      PT LONG TERM GOAL #2   Title  Patient will increase dynamic gait index score to >19/24 as to demonstrate reduced fall risk and improved dynamic gait balance for better safety with community/home ambulation.     Baseline  8/26: 11/24    Time  8    Period  Weeks    Status  New    Target Date  02/04/18      PT LONG TERM GOAL #3   Title  Patient will increase BLE gross strength to 4+/5 as to improve functional strength for independent gait, increased standing tolerance and increased ADL ability.    Baseline  8/26" unable to perform standing heel raise    Time  8    Period  Weeks    Status  New    Target Date  02/04/18      PT LONG TERM GOAL #4   Title  Patient will increase lower extremity functional scale to >50/80 to demonstrate improved functional mobility and increased tolerance with ADLs.     Baseline  8/26: 27/80     Time  8    Period  Weeks    Status  New    Target Date  02/04/18            Plan - 12/31/17 1548    Clinical Impression Statement  Patient demonstrated improved stability during transition of sit to stand from raised plinth table with repetition. Initially patient lost balance on every rep, however upon second set patient only lost balance  twice. Patient fatigued with standing interventions with shaking of bilateral LE evident. Patient will benefit from skilled physical therapy to improve strength, stability, and mobility to decrease risk of falling with ambulation.     Rehab Potential   Fair    Clinical Impairments Affecting Rehab Potential  (+) family support, age, (-) chronic E choli     PT Frequency  2x / week    PT Duration  8 weeks    PT Treatment/Interventions  ADLs/Self Care Home Management;Aquatic Therapy;Cryotherapy;Ultrasound;Traction;Moist Heat;Electrical Stimulation;DME Instruction;Gait training;Stair training;Functional mobility training;Neuromuscular re-education;Balance training;Therapeutic exercise;Therapeutic activities;Patient/family education;Orthotic Fit/Training;Manual techniques;Dry needling;Passive range of motion;Compression bandaging;Manual lymph drainage;Energy conservation;Splinting;Taping;Vasopneumatic Device;Visual/perceptual remediation/compensation;Vestibular    PT Next Visit Plan  review HEP, ankle strengthen and stability     PT Home Exercise Plan  see sheet    Consulted and Agree with Plan of Care  Patient;Family member/caregiver    Family Member Consulted  wife       Patient will benefit from skilled therapeutic intervention in order to improve the following deficits and impairments:  Abnormal gait, Cardiopulmonary status limiting activity, Decreased activity tolerance, Decreased balance, Decreased knowledge of precautions, Decreased endurance, Decreased mobility, Difficulty walking, Decreased strength, Increased edema, Impaired flexibility, Impaired perceived functional ability, Postural dysfunction, Improper body mechanics  Visit Diagnosis: Muscle weakness (generalized)  Other abnormalities of gait and mobility     Problem List There are no active problems to display for this patient.  Precious BardMarina Naidelyn Parrella, PT, DPT   12/31/2017, 4:07 PM  [place] [facility] [address] [place], [place], [address] Phone: [phone]   Fax:  [phone]  Name: Nathan [name] MRN: [id number] Date of Birth: [birth year]

## 2018-01-03 ENCOUNTER — Ambulatory Visit: Payer: Medicare Other

## 2018-01-03 DIAGNOSIS — M6281 Muscle weakness (generalized): Secondary | ICD-10-CM | POA: Diagnosis not present

## 2018-01-03 DIAGNOSIS — R2689 Other abnormalities of gait and mobility: Secondary | ICD-10-CM

## 2018-01-03 NOTE — Therapy (Signed)
[facility] [facility] [address] [place], [place], [address] Phone: [phone]   Fax:  [phone]  Physical Therapy Treatment  Patient Details  Name: Nathan [name] MRN: [id number] Date of Birth: [birth year] No data recorded  Encounter Date: 01/03/2018  PT End of Session - 01/03/18 1108    Visit Number  4    Number of Visits  16    Date for PT Re-Evaluation  02/04/18    Authorization Type  4/10  start date 12/10/17     PT Start Time  1101    PT Stop Time  1146    PT Time Calculation (min)  45 min    Equipment Utilized During Treatment  Gait belt    Activity Tolerance  Patient tolerated treatment well    Behavior During Therapy  [facility] for tasks assessed/performed       Past Medical History:  Diagnosis Date  . E. coli infection   . History of appendectomy     Past Surgical History:  Procedure Laterality Date  . cardiac catherization    . GASTRIC BYPASS    . upper blepharoplasty      There were no vitals filed for this visit.    Nustep lvl 3 4 minutes RPM >60 for cardiovascular.    Treat: Airex pad: static stand no UE support; 2x 60 seconds, frequent posterior LOB with limited ankle stability initially Airex pad: eyes closed 3x30 seconds Bosu ball lunges 10x forwards, BUE support Bosu ball side lunges 12x, BUE support  Half bosu ball : flat side down 2 minutes, finger tip support Tossing weighted ball (2000 Gr) ball 20x to challenge ankle response   Seated:              Hip adduction squeezes 12x 3 second holds              ABC L and R LE             PF 10x   10 circles clockwise, 10 counterclockwise dyna disc ; L and R       Patient requires mod-max cueing for task orientation throughout session.   Pt. response to medical necessity: Patient will benefit from skilled physical therapy to improve strength, stability, and mobility to decrease risk of falling with  ambulation                        PT Education - 01/03/18 1108    Education Details  exercise technique, stability     Person(s) Educated  Patient    Methods  Explanation;Demonstration;Verbal cues    Comprehension  Verbalized understanding;Returned demonstration       PT Short Term Goals - 12/10/17 1811      PT SHORT TERM GOAL #1   Title  Patient will be independent in home exercise program to improve strength/mobility for better functional independence with ADLs.    Baseline  HEP given     Time  2    Period  Weeks    Status  New    Target Date  12/24/17      PT SHORT TERM GOAL #2   Title  Patient will deny any falls over past 2 weeks to demonstrate improved safety awareness at home and work.     Baseline  falls 1-2x/week     Time  2    Period  Weeks    Status  New  Target Date  12/24/17        PT Long Term Goals - 12/10/17 1813      PT LONG TERM GOAL #1   Title  Patient will deny any falls over past 4 weeks to demonstrate improved safety awareness at home and work.     Baseline  falls 1-2x/week     Time  8    Period  Weeks    Status  New    Target Date  02/04/18      PT LONG TERM GOAL #2   Title  Patient will increase dynamic gait index score to >19/24 as to demonstrate reduced fall risk and improved dynamic gait balance for better safety with community/home ambulation.     Baseline  8/26: 11/24    Time  8    Period  Weeks    Status  New    Target Date  02/04/18      PT LONG TERM GOAL #3   Title  Patient will increase BLE gross strength to 4+/5 as to improve functional strength for independent gait, increased standing tolerance and increased ADL ability.    Baseline  8/26" unable to perform standing heel raise    Time  8    Period  Weeks    Status  New    Target Date  02/04/18      PT LONG TERM GOAL #4   Title  Patient will increase lower extremity functional scale to >50/80 to demonstrate improved functional mobility and increased  tolerance with ADLs.     Baseline  8/26: 27/80     Time  8    Period  Weeks    Status  New    Target Date  02/04/18            Plan - 01/03/18 1211    Clinical Impression Statement   Patient tolerated increased standing dynamic stability interventions with utilization of BUE support. Patient challenged with prolonged standing due to poor capacity for functional activities. Poor ankle stability with dynamic surfaced noted with interventions tailored to improve impairment. Patient will benefit from skilled physical therapy to improve strength, stability, and mobility to decrease risk of falling with ambulation    Rehab Potential  Fair    Clinical Impairments Affecting Rehab Potential  (+) family support, age, (-) chronic E choli     PT Frequency  2x / week    PT Duration  8 weeks    PT Treatment/Interventions  ADLs/Self Care Home Management;Aquatic Therapy;Cryotherapy;Ultrasound;Traction;Moist Heat;Electrical Stimulation;DME Instruction;Gait training;Stair training;Functional mobility training;Neuromuscular re-education;Balance training;Therapeutic exercise;Therapeutic activities;Patient/family education;Orthotic Fit/Training;Manual techniques;Dry needling;Passive range of motion;Compression bandaging;Manual lymph drainage;Energy conservation;Splinting;Taping;Vasopneumatic Device;Visual/perceptual remediation/compensation;Vestibular    PT Next Visit Plan  review HEP, ankle strengthen and stability     PT Home Exercise Plan  see sheet    Consulted and Agree with Plan of Care  Patient;Family member/caregiver    Family Member Consulted  wife       Patient will benefit from skilled therapeutic intervention in order to improve the following deficits and impairments:  Abnormal gait, Cardiopulmonary status limiting activity, Decreased activity tolerance, Decreased balance, Decreased knowledge of precautions, Decreased endurance, Decreased mobility, Difficulty walking, Decreased strength, Increased  edema, Impaired flexibility, Impaired perceived functional ability, Postural dysfunction, Improper body mechanics  Visit Diagnosis: Muscle weakness (generalized)  Other abnormalities of gait and mobility     Problem List There are no active problems to display for this patient.  Precious Bard, PT, DPT   01/03/2018, 12:12  PM  [facility] MAIN [facility] SERVICES [address] [place], [place], [address] Phone: [phone]   Fax:  [phone]  Name: Nathan [name] MRN: [id number] Date of Birth: [birth year]

## 2018-01-07 ENCOUNTER — Ambulatory Visit: Payer: Medicare Other

## 2018-01-07 DIAGNOSIS — M6281 Muscle weakness (generalized): Secondary | ICD-10-CM | POA: Diagnosis not present

## 2018-01-07 DIAGNOSIS — R2689 Other abnormalities of gait and mobility: Secondary | ICD-10-CM

## 2018-01-07 NOTE — Therapy (Signed)
[place] [facility] MAIN [facility] SERVICES [address] [place], [place], [address] Phone: [phone]   Fax:  [phone]  Physical Therapy Treatment  Patient Details  Name: Nathan [name] MRN: [id number] Date of Birth: October 01, 1950 No data recorded  Encounter Date: 01/07/2018  PT End of Session - 01/07/18 1056    Visit Number  5    Number of Visits  16    Date for PT Re-Evaluation  02/04/18    Authorization Type  5/10  start date 12/10/17     PT Start Time  1100    PT Stop Time  1146    PT Time Calculation (min)  46 min    Equipment Utilized During Treatment  Gait belt    Activity Tolerance  Patient tolerated treatment well    Behavior During Therapy  [facility] for tasks assessed/performed       Past Medical History:  Diagnosis Date  . E. coli infection   . History of appendectomy     Past Surgical History:  Procedure Laterality Date  . cardiac catherization    . GASTRIC BYPASS    . upper blepharoplasty      There were no vitals filed for this visit.  Subjective Assessment - 01/07/18 1153    Subjective  Patient reports doing some of his exercises. Has not fallen since his last visit. No compliants or concerns. Has been busy over the weekend.     Patient is accompained by:  Family member    Pertinent History  Nathan [name] is a right handed 67 y.o. male retiree with history of diabetes, hypertension , here for evaluation of lower extremity weakness. Patient reports weakness in legs began in December last year. 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 No headaches, no dizziness, no numbness of his legs, no back problems, has never had back surgery. Only recent thing is going on nitrofurantoin twice daily for persistent UTI. Looking for CIDV.     Limitations  Standing;Walking;House hold activities;Other (comment)    How long can you sit comfortably?  n/a    How long can you stand comfortably?  4-5 minutes    How long  can you walk comfortably?  challenging with inclines and stopping.     Diagnostic tests  MRI for cervical spine    Patient Stated Goals  want to walk safer, stop without falling.     Currently in Pain?  No/denies      Nustep lvl 4 4 minutes RPM >60 for cardiovascular.     Treat: Airex pad: static stand no UE support;horizontal head turns, vertical head turns 2x 60 seconds, frequent posterior LOB with limited ankle stability initially Airex pad: eyes closed 3x30 seconds Airex pad 6 in step : toe taps BUE support 10x each leg, patient fatigued Airex pad 6 in step: lateral toe taps BUE support 12x each leg Half foam roller  : flat side down 2 minutes, finger tip support Step over and back orange hurdle 10x each leg BUE support Transfer cones; reach to R and then squat to place cone on step (placed onto side to make ~1 foot off the ground) x6 cones x2 trials, then perform opposite diagonal x6 cones x 2 trials.     Seated:              Hip adduction squeezes 12x 3 second holds              ball between  feet squeezing for adduction with combined LAQ 10x              PF with DF 10x                  Patient requires mod-max cueing for task orientation throughout session.   Pt. response to medical necessity: Patient will benefit from skilled physical therapy to improve strength, stability, and mobility to decrease risk of falling with ambulation                            PT Education - 01/07/18 1055    Education Details  exercise technique, static and dynamic stability     Person(s) Educated  Patient    Methods  Explanation;Demonstration    Comprehension  Verbalized understanding;Returned demonstration       PT Short Term Goals - 12/10/17 1811      PT SHORT TERM GOAL #1   Title  Patient will be independent in home exercise program to improve strength/mobility for better functional independence with ADLs.    Baseline  HEP given     Time  2    Period  Weeks     Status  New    Target Date  12/24/17      PT SHORT TERM GOAL #2   Title  Patient will deny any falls over past 2 weeks to demonstrate improved safety awareness at home and work.     Baseline  falls 1-2x/week     Time  2    Period  Weeks    Status  New    Target Date  12/24/17        PT Long Term Goals - 12/10/17 1813      PT LONG TERM GOAL #1   Title  Patient will deny any falls over past 4 weeks to demonstrate improved safety awareness at home and work.     Baseline  falls 1-2x/week     Time  8    Period  Weeks    Status  New    Target Date  02/04/18      PT LONG TERM GOAL #2   Title  Patient will increase dynamic gait index score to >19/24 as to demonstrate reduced fall risk and improved dynamic gait balance for better safety with community/home ambulation.     Baseline  8/26: 11/24    Time  8    Period  Weeks    Status  New    Target Date  02/04/18      PT LONG TERM GOAL #3   Title  Patient will increase BLE gross strength to 4+/5 as to improve functional strength for independent gait, increased standing tolerance and increased ADL ability.    Baseline  8/26" unable to perform standing heel raise    Time  8    Period  Weeks    Status  New    Target Date  02/04/18      PT LONG TERM GOAL #4   Title  Patient will increase lower extremity functional scale to >50/80 to demonstrate improved functional mobility and increased tolerance with ADLs.     Baseline  8/26: 27/80     Time  8    Period  Weeks    Status  New    Target Date  02/04/18            Plan - 01/07/18 1157  Clinical Impression Statement  Patient demonstrated improved ankle stability with decreased episodes of ankle/knee buckling. Patient continues to be challenged with initiation and termination of movement often resulting in LOB when performed without UE support. Patient reliant upon UE's for stability at this time. Patient will benefit from skilled physical therapy to improve strength,  stability, and mobility to decrease risk of falling with ambulation    Rehab Potential  Fair    Clinical Impairments Affecting Rehab Potential  (+) family support, age, (-) chronic E choli     PT Frequency  2x / week    PT Duration  8 weeks    PT Treatment/Interventions  ADLs/Self Care Home Management;Aquatic Therapy;Cryotherapy;Ultrasound;Traction;Moist Heat;Electrical Stimulation;DME Instruction;Gait training;Stair training;Functional mobility training;Neuromuscular re-education;Balance training;Therapeutic exercise;Therapeutic activities;Patient/family education;Orthotic Fit/Training;Manual techniques;Dry needling;Passive range of motion;Compression bandaging;Manual lymph drainage;Energy conservation;Splinting;Taping;Vasopneumatic Device;Visual/perceptual remediation/compensation;Vestibular    PT Next Visit Plan  review HEP, ankle strengthen and stability     PT Home Exercise Plan  see sheet    Consulted and Agree with Plan of Care  Patient;Family member/caregiver    Family Member Consulted  wife       Patient will benefit from skilled therapeutic intervention in order to improve the following deficits and impairments:  Abnormal gait, Cardiopulmonary status limiting activity, Decreased activity tolerance, Decreased balance, Decreased knowledge of precautions, Decreased endurance, Decreased mobility, Difficulty walking, Decreased strength, Increased edema, Impaired flexibility, Impaired perceived functional ability, Postural dysfunction, Improper body mechanics  Visit Diagnosis: Muscle weakness (generalized)  Other abnormalities of gait and mobility     Problem List There are no active problems to display for this patient.  Precious BardMarina Renny Gunnarson, PT, DPT   01/07/2018, 11:58 AM  [place] [facility] [address] [place], [place], [address] Phone: [phone]   Fax:  [phone]  Name: Nathan [name] MRN: [id number] Date of Birth:  October 31, 1950

## 2018-01-09 ENCOUNTER — Ambulatory Visit: Payer: Medicare Other

## 2018-01-09 DIAGNOSIS — R2689 Other abnormalities of gait and mobility: Secondary | ICD-10-CM

## 2018-01-09 DIAGNOSIS — M6281 Muscle weakness (generalized): Secondary | ICD-10-CM | POA: Diagnosis not present

## 2018-01-09 NOTE — Therapy (Signed)
[place] [facility] MAIN [facility] [address] [place], [place], [address] Phone: [phone]   Fax:  [phone]  Physical Therapy Treatment  Patient Details  Name: Nathan [name] MRN: [id number] Date of Birth: Apr 18, 1950 No data recorded  Encounter Date: 01/09/2018  PT End of Session - 01/09/18 1304    Visit Number  6    Number of Visits  16    Date for PT Re-Evaluation  02/04/18    Authorization Type  6/10  start date 12/10/17     PT Start Time  1200    PT Stop Time  1245    PT Time Calculation (min)  45 min    Equipment Utilized During Treatment  Gait belt    Activity Tolerance  Patient tolerated treatment well    Behavior During Therapy  [facility] for tasks assessed/performed       Past Medical History:  Diagnosis Date  . E. coli infection   . History of appendectomy     Past Surgical History:  Procedure Laterality Date  . cardiac catherization    . GASTRIC BYPASS    . upper blepharoplasty      There were no vitals filed for this visit.  Subjective Assessment - 01/09/18 1302    Subjective  Patient reports compliance with HEP yesterday. No stumbles or falls since last seen. No pain or complaints at this time.     Patient is accompained by:  Family member    Pertinent History  Mr. [name] is a right handed 67 y.o. male retiree with history of diabetes, hypertension , here for evaluation of lower extremity weakness. Patient reports weakness in legs began in December last year. 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 No headaches, no dizziness, no numbness of his legs, no back problems, has never had back surgery. Only recent thing is going on nitrofurantoin twice daily for persistent UTI. Looking for CIDV.     Limitations  Standing;Walking;House hold activities;Other (comment)    How long can you sit comfortably?  n/a    How long can you stand comfortably?  4-5 minutes    How long can you walk  comfortably?  challenging with inclines and stopping.     Diagnostic tests  MRI for cervical spine    Patient Stated Goals  want to walk safer, stop without falling.     Currently in Pain?  No/denies       Nustep lvl 4 4 minutes RPM >60 for cardiovascular.     Treat: Airex pad 6 in step : toe taps BUE support 10x each leg, patient fatigued Airex pad 3lb bar upright raises 10x  Airex pad: 3lb bar horizontal turns 10x ; more LOB when turning to L Step over and back orange hurdle 10x each leg BUE support 6" step up toss ball into hoop x 20. SUE support  Transfer cones; reach to R and then squat to place cone on step (placed onto side to make ~1 foot off the ground) x6 cones x2 trials, then perform opposite diagonal x6 cones x 2 trials.     Seated:              Hip adduction squeezes 12x 3 second holds              ball between feet squeezing for adduction with combined LAQ 10x              PF with DF  10x        Patient requires mod-max cueing for task orientation throughout session.   Pt. response to medical necessity: Patient will benefit from skilled physical therapy to improve strength, stability, and mobility to decrease risk of falling with ambulation                          PT Education - 01/09/18 1303    Education Details  exercise technique, static and dynamic stability     Person(s) Educated  Patient    Methods  Explanation;Demonstration;Verbal cues    Comprehension  Verbalized understanding;Returned demonstration       PT Short Term Goals - 12/10/17 1811      PT SHORT TERM GOAL #1   Title  Patient will be independent in home exercise program to improve strength/mobility for better functional independence with ADLs.    Baseline  HEP given     Time  2    Period  Weeks    Status  New    Target Date  12/24/17      PT SHORT TERM GOAL #2   Title  Patient will deny any falls over past 2 weeks to demonstrate improved safety awareness at home and  work.     Baseline  falls 1-2x/week     Time  2    Period  Weeks    Status  New    Target Date  12/24/17        PT Long Term Goals - 12/10/17 1813      PT LONG TERM GOAL #1   Title  Patient will deny any falls over past 4 weeks to demonstrate improved safety awareness at home and work.     Baseline  falls 1-2x/week     Time  8    Period  Weeks    Status  New    Target Date  02/04/18      PT LONG TERM GOAL #2   Title  Patient will increase dynamic gait index score to >19/24 as to demonstrate reduced fall risk and improved dynamic gait balance for better safety with community/home ambulation.     Baseline  8/26: 11/24    Time  8    Period  Weeks    Status  New    Target Date  02/04/18      PT LONG TERM GOAL #3   Title  Patient will increase BLE gross strength to 4+/5 as to improve functional strength for independent gait, increased standing tolerance and increased ADL ability.    Baseline  8/26" unable to perform standing heel raise    Time  8    Period  Weeks    Status  New    Target Date  02/04/18      PT LONG TERM GOAL #4   Title  Patient will increase lower extremity functional scale to >50/80 to demonstrate improved functional mobility and increased tolerance with ADLs.     Baseline  8/26: 27/80     Time  8    Period  Weeks    Status  New    Target Date  02/04/18            Plan - 01/09/18 1330    Clinical Impression Statement  Patient challenged static or semi dynamic balance interventions on unstable surfaces. Patient had more episodes of LOB when turning ot L due to PT being on right so paint felt more  secure indicating potential brain body connection. Patient fatigued quickly with standing interventions, poor ankle stability noted with poor ankle correction. Patient will benefit from skilled physical therapy to improve strength, stability, and mobility to decrease risk of falling with ambulation    Rehab Potential  Fair    Clinical Impairments Affecting  Rehab Potential  (+) family support, age, (-) chronic E choli     PT Frequency  2x / week    PT Duration  8 weeks    PT Treatment/Interventions  ADLs/Self Care Home Management;Aquatic Therapy;Cryotherapy;Ultrasound;Traction;Moist Heat;Electrical Stimulation;DME Instruction;Gait training;Stair training;Functional mobility training;Neuromuscular re-education;Balance training;Therapeutic exercise;Therapeutic activities;Patient/family education;Orthotic Fit/Training;Manual techniques;Dry needling;Passive range of motion;Compression bandaging;Manual lymph drainage;Energy conservation;Splinting;Taping;Vasopneumatic Device;Visual/perceptual remediation/compensation;Vestibular    PT Next Visit Plan  review HEP, ankle strengthen and stability     PT Home Exercise Plan  see sheet    Consulted and Agree with Plan of Care  Patient;Family member/caregiver    Family Member Consulted  wife       Patient will benefit from skilled therapeutic intervention in order to improve the following deficits and impairments:  Abnormal gait, Cardiopulmonary status limiting activity, Decreased activity tolerance, Decreased balance, Decreased knowledge of precautions, Decreased endurance, Decreased mobility, Difficulty walking, Decreased strength, Increased edema, Impaired flexibility, Impaired perceived functional ability, Postural dysfunction, Improper body mechanics  Visit Diagnosis: Muscle weakness (generalized)  Other abnormalities of gait and mobility     Problem List There are no active problems to display for this patient.  Precious Bard, PT, DPT   01/09/2018, 1:45 PM  [place] [facility] MAIN [facility] SERVICES [address] [place], [place], [address] Phone: [phone]   Fax:  [phone]  Name: Nathan [name] MRN: [id number] Date of Birth: 08-10-1950

## 2018-01-15 ENCOUNTER — Ambulatory Visit: Payer: Medicare Other | Attending: Internal Medicine

## 2018-01-15 DIAGNOSIS — R2689 Other abnormalities of gait and mobility: Secondary | ICD-10-CM | POA: Diagnosis present

## 2018-01-15 DIAGNOSIS — M6281 Muscle weakness (generalized): Secondary | ICD-10-CM

## 2018-01-15 NOTE — Therapy (Addendum)
[place] [facility] MAIN [facility] SERVICES [address] [place], [place], [address] Phone: [phone]   Fax:  [phone]  Physical Therapy Treatment  Patient Details  Name: Nathan [name] MRN: [id number] Date of Birth: [birth year] No data recorded  Encounter Date: 01/15/2018  PT End of Session - 01/15/18 1357    Visit Number  7    Number of Visits  16    Date for PT Re-Evaluation  02/04/18    Authorization Type  7/10  start date 12/10/17     PT Start Time  1303    PT Stop Time  1345    PT Time Calculation (min)  42 min    Equipment Utilized During Treatment  Gait belt    Activity Tolerance  Patient tolerated treatment well    Behavior During Therapy  WFL for tasks assessed/performed       Past Medical History:  Diagnosis Date  . E. coli infection   . History of appendectomy     Past Surgical History:  Procedure Laterality Date  . cardiac catherization    . GASTRIC BYPASS    . upper blepharoplasty      There were no vitals filed for this visit.  Subjective Assessment - 01/15/18 1306    Subjective  Patient did not bring cane to clinic today. Reports lifting the wrong way on Friday and has had mild back pain since. Only has pain when he moves the wrong way. Reports HEP compliance. No stumbles or falls since last session    Pertinent History  Mr. Harada is a right handed 67 y.o. male retiree with history of diabetes, hypertension , here for evaluation of lower extremity weakness. Patient reports weakness in legs began in December last year. 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 No headaches, no dizziness, no numbness of his legs, no back problems, has never had back surgery. Only recent thing is going on nitrofurantoin twice daily for persistent UTI. Looking for CIDV.     Limitations  Standing;Walking;House hold activities;Other (comment)    How long can you sit comfortably?  n/a    How long can you stand  comfortably?  4-5 minutes    How long can you walk comfortably?  challenging with inclines and stopping.     Diagnostic tests  MRI for cervical spine    Patient Stated Goals  want to walk safer, stop without falling.     Currently in Pain?  Yes    Pain Score  2     Pain Location  Back    Pain Descriptors / Indicators  Aching    Pain Type  Acute pain    Pain Onset  In the past 7 days            Nustep lvl 4 4 minutes RPM >60 for cardiovascular  Treat: Step over and back orange hurdle fwx/bwd 10x each leg  1 UE support. Verbal cues to increase step height.   Lateral steps over orange hurdle x10 each leg. 1 UE support. Verbal cues to increase step height  Airex pad narrow BOS left/right, up/down head turns. x10 each direction. No UE support.   Airex pad tapping 3 colored stepping stones. In lateral order, progressing to color therapist states 1-2-3 sequences at a time.   Airex pad 6 in step : toe taps BUE support 10x each leg. Verbal cues to increase step height.   Airex pad step up and down  on 6in step. x10  Airex pad 3lb bar upright raises 10x. Verbal cues to increase core activation   Airex pad: 3lb bar horizontal turns 10x ;Vertical cues for core activation.   Backwards walking x 2 length of //bars. BUE but faded with repetition. Step to gait pattern.    Seated:    Ankle circles 15x cw/ccw with dyna disc             DF 10x                   PT Education - 01/15/18 1309    Education Details  exercise technique, static and dynamic stability     Person(s) Educated  Patient    Methods  Explanation;Demonstration    Comprehension  Verbalized understanding;Returned demonstration       PT Short Term Goals - 12/10/17 1811      PT SHORT TERM GOAL #1   Title  Patient will be independent in home exercise program to improve strength/mobility for better functional independence with ADLs.    Baseline  HEP given     Time  2    Period  Weeks    Status  New     Target Date  12/24/17      PT SHORT TERM GOAL #2   Title  Patient will deny any falls over past 2 weeks to demonstrate improved safety awareness at home and work.     Baseline  falls 1-2x/week     Time  2    Period  Weeks    Status  New    Target Date  12/24/17        PT Long Term Goals - 12/10/17 1813      PT LONG TERM GOAL #1   Title  Patient will deny any falls over past 4 weeks to demonstrate improved safety awareness at home and work.     Baseline  falls 1-2x/week     Time  8    Period  Weeks    Status  New    Target Date  02/04/18      PT LONG TERM GOAL #2   Title  Patient will increase dynamic gait index score to >19/24 as to demonstrate reduced fall risk and improved dynamic gait balance for better safety with community/home ambulation.     Baseline  8/26: 11/24    Time  8    Period  Weeks    Status  New    Target Date  02/04/18      PT LONG TERM GOAL #3   Title  Patient will increase BLE gross strength to 4+/5 as to improve functional strength for independent gait, increased standing tolerance and increased ADL ability.    Baseline  8/26" unable to perform standing heel raise    Time  8    Period  Weeks    Status  New    Target Date  02/04/18      PT LONG TERM GOAL #4   Title  Patient will increase lower extremity functional scale to >50/80 to demonstrate improved functional mobility and increased tolerance with ADLs.     Baseline  8/26: 27/80     Time  8    Period  Weeks    Status  New    Target Date  02/04/18            Plan - 01/15/18 1356    Clinical Impression Statement  Patient progressed to head turns  in narrow BOS and only needing 1 UE support to complete step overs demonstrating improvements in balance. Patient challenged with static balance activities including upright bar raises with noticeable posterior sway. Patient has more difficulty balancing on L LE during stepping stone task indicating more weakness in that extremity. Patient will  continue to benefit from skilled therapy to improve strength, balance, and stability to decrease fall risk with ambulation.     Rehab Potential  Fair    Clinical Impairments Affecting Rehab Potential  (+) family support, age, (-) chronic E choli     PT Frequency  2x / week    PT Duration  8 weeks    PT Treatment/Interventions  ADLs/Self Care Home Management;Aquatic Therapy;Cryotherapy;Ultrasound;Traction;Moist Heat;Electrical Stimulation;DME Instruction;Gait training;Stair training;Functional mobility training;Neuromuscular re-education;Balance training;Therapeutic exercise;Therapeutic activities;Patient/family education;Orthotic Fit/Training;Manual techniques;Dry needling;Passive range of motion;Compression bandaging;Manual lymph drainage;Energy conservation;Splinting;Taping;Vasopneumatic Device;Visual/perceptual remediation/compensation;Vestibular    PT Next Visit Plan  review HEP, ankle strengthen and stability     PT Home Exercise Plan  see sheet    Consulted and Agree with Plan of Care  Patient;Family member/caregiver    Family Member Consulted  wife       Patient will benefit from skilled therapeutic intervention in order to improve the following deficits and impairments:  Abnormal gait, Cardiopulmonary status limiting activity, Decreased activity tolerance, Decreased balance, Decreased knowledge of precautions, Decreased endurance, Decreased mobility, Difficulty walking, Decreased strength, Increased edema, Impaired flexibility, Impaired perceived functional ability, Postural dysfunction, Improper body mechanics  Visit Diagnosis: Muscle weakness (generalized)  Other abnormalities of gait and mobility     Problem List There are no active problems to display for this patient.  Vanessa Ralphs, SPT  This entire session was performed under direct supervision and direction of a licensed therapist/therapist assistant . I have personally read, edited and approve of the note as  written. Precious Bard, PT, DPT   01/15/2018, 2:44 PM  [place] [facility] MAIN [facility] SERVICES [address] [place], [place], [address] Phone: [phone]   Fax:  [phone]  Name: DEARIES MEIKLE MRN: [id number] Date of Birth: Sep 21, 1950

## 2018-01-17 ENCOUNTER — Ambulatory Visit: Payer: Medicare Other

## 2018-01-17 DIAGNOSIS — M6281 Muscle weakness (generalized): Secondary | ICD-10-CM

## 2018-01-17 DIAGNOSIS — R2689 Other abnormalities of gait and mobility: Secondary | ICD-10-CM

## 2018-01-17 NOTE — Therapy (Signed)
[place] MAIN [facility] SERVICES [address] [place], [place], [address] Phone: [phone]   Fax:  [phone]  Physical Therapy Treatment  Patient Details  Name: Nathan [name] MRN: [id number] Date of Birth: 1951/04/10 No data recorded  Encounter Date: 01/17/2018  PT End of Session - 01/17/18 1305    Visit Number  8    Number of Visits  16    Date for PT Re-Evaluation  02/04/18    Authorization Type  8/10  start date 12/10/17     PT Start Time  1300    PT Stop Time  1344    PT Time Calculation (min)  44 min    Equipment Utilized During Treatment  Gait belt    Activity Tolerance  Patient tolerated treatment well    Behavior During Therapy  [facility] for tasks assessed/performed       Past Medical History:  Diagnosis Date  . E. coli infection   . History of appendectomy     Past Surgical History:  Procedure Laterality Date  . cardiac catherization    . GASTRIC BYPASS    . upper blepharoplasty      There were no vitals filed for this visit.  Subjective Assessment - 01/17/18 1304    Subjective  Patient reports he has been feeling well. His wife is getting a Armed forces logistics/support/administrative officer. Reports no falls or LOB since last session.     Pertinent History  [name] is a right handed 67 y.o. male retiree with history of diabetes, hypertension , here for evaluation of lower extremity weakness. Patient reports weakness in legs began in December last year. 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 No headaches, no dizziness, no numbness of his legs, no back problems, has never had back surgery. Only recent thing is going on nitrofurantoin twice daily for persistent UTI. Looking for CIDV.     Limitations  Standing;Walking;House hold activities;Other (comment)    How long can you sit comfortably?  n/a    How long can you stand comfortably?  4-5 minutes    How long can you walk comfortably?  challenging with inclines and  stopping.     Diagnostic tests  MRI for cervical spine    Patient Stated Goals  want to walk safer, stop without falling.     Currently in Pain?  No/denies        Nustep lvl 4 4 minutes RPM >60 for cardiovascular   Treat: Step over and back orange hurdle fwx/bwd 10x each leg  1 hand finger tip support. Verbal cues to increase step height.    Lateral steps over orange hurdle x10 each leg. 1 UE support. Verbal cues to increase step height   Airex pad narrow BOS, 60 seconds  Airex pad: tandem stance 2x 80 seconds, frequent LOB   Airex pad weighted ball 2000 G upright raises 10x. Verbal cues to increase core activation Patient requires Min tactile cueing for body alignment    Airex pad: weighted ball toss 2000 G tosses 15x CGA (2 person, one person tossing one person CGA)    Stand one leg on green dynadisc 2x 30 seconds each leg,CGA in // bars                             PT Education - 01/17/18 1304    Education Details  exercise technique, static and dynamic stability  Person(s) Educated  Patient    Methods  Explanation;Demonstration;Verbal cues    Comprehension  Verbalized understanding;Returned demonstration       PT Short Term Goals - 12/10/17 1811      PT SHORT TERM GOAL #1   Title  Patient will be independent in home exercise program to improve strength/mobility for better functional independence with ADLs.    Baseline  HEP given     Time  2    Period  Weeks    Status  New    Target Date  12/24/17      PT SHORT TERM GOAL #2   Title  Patient will deny any falls over past 2 weeks to demonstrate improved safety awareness at home and work.     Baseline  falls 1-2x/week     Time  2    Period  Weeks    Status  New    Target Date  12/24/17        PT Long Term Goals - 12/10/17 1813      PT LONG TERM GOAL #1   Title  Patient will deny any falls over past 4 weeks to demonstrate improved safety awareness at home and work.     Baseline  falls  1-2x/week     Time  8    Period  Weeks    Status  New    Target Date  02/04/18      PT LONG TERM GOAL #2   Title  Patient will increase dynamic gait index score to >19/24 as to demonstrate reduced fall risk and improved dynamic gait balance for better safety with community/home ambulation.     Baseline  8/26: 11/24    Time  8    Period  Weeks    Status  New    Target Date  02/04/18      PT LONG TERM GOAL #3   Title  Patient will increase BLE gross strength to 4+/5 as to improve functional strength for independent gait, increased standing tolerance and increased ADL ability.    Baseline  8/26" unable to perform standing heel raise    Time  8    Period  Weeks    Status  New    Target Date  02/04/18      PT LONG TERM GOAL #4   Title  Patient will increase lower extremity functional scale to >50/80 to demonstrate improved functional mobility and increased tolerance with ADLs.     Baseline  8/26: 27/80     Time  8    Period  Weeks    Status  New    Target Date  02/04/18            Plan - 01/17/18 1335    Clinical Impression Statement  Patient challenged with reducing UE support to rely on LE stability. Static positioning more challengeing than dynamic.  Patient requires min A for body alignment when lifting heavy objects due to preference for posterior trunk lean. Patient will continue to benefit from skilled therapy to improve strength, balance, and stability to decrease fall risk with ambulation.    Rehab Potential  Fair    Clinical Impairments Affecting Rehab Potential  (+) family support, age, (-) chronic E choli     PT Frequency  2x / week    PT Duration  8 weeks    PT Treatment/Interventions  ADLs/Self Care Home Management;Aquatic Therapy;Cryotherapy;Ultrasound;Traction;Moist Heat;Electrical Stimulation;DME Instruction;Gait training;Stair training;Functional mobility training;Neuromuscular re-education;Balance training;Therapeutic exercise;Therapeutic  activities;Patient/family  education;Orthotic Fit/Training;Manual techniques;Dry needling;Passive range of motion;Compression bandaging;Manual lymph drainage;Energy conservation;Splinting;Taping;Vasopneumatic Device;Visual/perceptual remediation/compensation;Vestibular    PT Next Visit Plan  review HEP, ankle strengthen and stability     PT Home Exercise Plan  see sheet    Consulted and Agree with Plan of Care  Patient;Family member/caregiver    Family Member Consulted  wife       Patient will benefit from skilled therapeutic intervention in order to improve the following deficits and impairments:  Abnormal gait, Cardiopulmonary status limiting activity, Decreased activity tolerance, Decreased balance, Decreased knowledge of precautions, Decreased endurance, Decreased mobility, Difficulty walking, Decreased strength, Increased edema, Impaired flexibility, Impaired perceived functional ability, Postural dysfunction, Improper body mechanics  Visit Diagnosis: Muscle weakness (generalized)  Other abnormalities of gait and mobility     Problem List There are no active problems to display for this patient.  Precious Bard, PT, DPT    01/17/2018, 2:58 PM  [facility] MAIN [facility] SERVICES [address] [place], [place], [address] Phone: [phone]   Fax:  [phone]  Name: WAYBURN SHALER MRN: [id number] Date of Birth: 07-20-1950

## 2018-01-22 ENCOUNTER — Ambulatory Visit: Payer: Medicare Other

## 2018-01-22 DIAGNOSIS — M6281 Muscle weakness (generalized): Secondary | ICD-10-CM | POA: Diagnosis not present

## 2018-01-22 DIAGNOSIS — R2689 Other abnormalities of gait and mobility: Secondary | ICD-10-CM

## 2018-01-22 NOTE — Therapy (Signed)
[facility] MAIN [facility] SERVICES [address] [place], [place], [address] Phone: [phone]   Fax:  [phone]  Physical Therapy Treatment  Patient Details  Name: Nathan [name] MRN: [id number] Date of Birth: 15-Jul-1950 No data recorded  Encounter Date: 01/22/2018  PT End of Session - 01/22/18 1336    Visit Number  9    Number of Visits  16    Date for PT Re-Evaluation  02/04/18    Authorization Type  9/10  start date 12/10/17     PT Start Time  1330    PT Stop Time  1414    PT Time Calculation (min)  44 min    Equipment Utilized During Treatment  Gait belt    Activity Tolerance  Patient tolerated treatment well    Behavior During Therapy  [facility] for tasks assessed/performed       Past Medical History:  Diagnosis Date  . E. coli infection   . History of appendectomy     Past Surgical History:  Procedure Laterality Date  . cardiac catherization    . GASTRIC BYPASS    . upper blepharoplasty      There were no vitals filed for this visit.  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 patient has been compliant with HEP, reports no stumbles or falls.     Pertinent History  Mr. Fennell is a right handed 67 y.o. male retiree with history of diabetes, hypertension , here for evaluation of lower extremity weakness. Patient reports weakness in legs began in December last year. 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 No headaches, no dizziness, no numbness of his legs, no back problems, has never had back surgery. Only recent thing is going on nitrofurantoin twice daily for persistent UTI. Looking for CIDV.     Limitations  Standing;Walking;House hold activities;Other (comment)    How long can you sit comfortably?  n/a    How long can you stand comfortably?  4-5 minutes    How long can you walk comfortably?  challenging with  inclines and stopping.     Diagnostic tests  MRI for cervical spine    Patient Stated Goals  want to walk safer, stop without falling.     Currently in Pain?  Yes    Pain Score  2     Pain Location  Back    Pain Orientation  Lower    Pain Descriptors / Indicators  Aching    Pain Type  Acute pain      Nustep Lvl 4 RPM>60 for cardiovascular support  Supine : LE rotation to decrease back pain 2 minutes Bridges 10x, 2 sets; cues for gluteal squeeze during concentric and eccentric portion  Straight leg raise 10x each leg with opp knee bent.  Posterior pelvic tilts 10x 4 second holds squishing PT's hand under lumbar spine  Seated: Marble pick up 10x to L and R each set for foot and ankle stability/strength ; patient requires cueing for decreasing velocity to improve coordination  RTB eversion 10x each LE RTB inversion 10x each LE                       PT Education - 01/22/18 1336    Education Details  exercise technique, static and dynamic stability     Person(s) Educated  Patient  Methods  Explanation;Demonstration;Verbal cues    Comprehension  Verbalized understanding;Returned demonstration       PT Short Term Goals - 12/10/17 1811      PT SHORT TERM GOAL #1   Title  Patient will be independent in home exercise program to improve strength/mobility for better functional independence with ADLs.    Baseline  HEP given     Time  2    Period  Weeks    Status  New    Target Date  12/24/17      PT SHORT TERM GOAL #2   Title  Patient will deny any falls over past 2 weeks to demonstrate improved safety awareness at home and work.     Baseline  falls 1-2x/week     Time  2    Period  Weeks    Status  New    Target Date  12/24/17        PT Long Term Goals - 12/10/17 1813      PT LONG TERM GOAL #1   Title  Patient will deny any falls over past 4 weeks to demonstrate improved safety awareness at home and work.     Baseline  falls 1-2x/week     Time  8     Period  Weeks    Status  New    Target Date  02/04/18      PT LONG TERM GOAL #2   Title  Patient will increase dynamic gait index score to >19/24 as to demonstrate reduced fall risk and improved dynamic gait balance for better safety with community/home ambulation.     Baseline  8/26: 11/24    Time  8    Period  Weeks    Status  New    Target Date  02/04/18      PT LONG TERM GOAL #3   Title  Patient will increase BLE gross strength to 4+/5 as to improve functional strength for independent gait, increased standing tolerance and increased ADL ability.    Baseline  8/26" unable to perform standing heel raise    Time  8    Period  Weeks    Status  New    Target Date  02/04/18      PT LONG TERM GOAL #4   Title  Patient will increase lower extremity functional scale to >50/80 to demonstrate improved functional mobility and increased tolerance with ADLs.     Baseline  8/26: 27/80     Time  8    Period  Weeks    Status  New    Target Date  02/04/18            Plan - 01/22/18 1422    Clinical Impression Statement  Patient's session today focused on ankle and intrinsic foot stability to increase ankle reactions for improved static and dynamic stability. Patient fatigued quickly with ankle and foot interventions. Patient presented initially with some residual low back pain that was reduced to 0/10 by end of session. Patient will continue to benefit from skilled therapy to improve strength, balance, and stability to decrease fall risk with ambulation    Rehab Potential  Fair    Clinical Impairments Affecting Rehab Potential  (+) family support, age, (-) chronic E choli     PT Frequency  2x / week    PT Duration  8 weeks    PT Treatment/Interventions  ADLs/Self Care Home Management;Aquatic Therapy;Cryotherapy;Ultrasound;Traction;Moist Heat;Electrical Stimulation;DME Instruction;Gait training;Stair training;Functional mobility training;Neuromuscular re-education;Balance  training;Therapeutic exercise;Therapeutic activities;Patient/family education;Orthotic Fit/Training;Manual techniques;Dry needling;Passive range of motion;Compression bandaging;Manual lymph drainage;Energy conservation;Splinting;Taping;Vasopneumatic Device;Visual/perceptual remediation/compensation;Vestibular    PT Next Visit Plan  review HEP, ankle strengthen and stability     PT Home Exercise Plan  see sheet    Consulted and Agree with Plan of Care  Patient;Family member/caregiver    Family Member Consulted  wife       Patient will benefit from skilled therapeutic intervention in order to improve the following deficits and impairments:  Abnormal gait, Cardiopulmonary status limiting activity, Decreased activity tolerance, Decreased balance, Decreased knowledge of precautions, Decreased endurance, Decreased mobility, Difficulty walking, Decreased strength, Increased edema, Impaired flexibility, Impaired perceived functional ability, Postural dysfunction, Improper body mechanics  Visit Diagnosis: Muscle weakness (generalized)  Other abnormalities of gait and mobility     Problem List There are no active problems to display for this patient.  Precious Bard, PT, DPT   01/22/2018, 2:24 PM  [place] [facility] MAIN [facility] SERVICES [address] [place], [place], [address] Phone: [phone]   Fax:  [phone]  Name: Nathan [name] MRN: [id number] Date of Birth: November 27, 1950

## 2018-01-29 ENCOUNTER — Ambulatory Visit: Payer: Medicare Other

## 2018-01-29 DIAGNOSIS — M6281 Muscle weakness (generalized): Secondary | ICD-10-CM

## 2018-01-29 DIAGNOSIS — R2689 Other abnormalities of gait and mobility: Secondary | ICD-10-CM

## 2018-01-29 NOTE — Therapy (Addendum)
[place] [facility] MAIN [facility] SERVICES [address] [place], [place], [address] Phone: [phone]   Fax:  [phone]  Physical Therapy Treatment and Progress Note   Dates of reporting period  12/10/17   to   01/29/18   Patient Details  Name: RAMIR MALERBA MRN: [id number] Date of Birth: 21-Jan-1951 No data recorded  Encounter Date: 01/29/2018  PT End of Session - 01/29/18 1401    Visit Number  10    Number of Visits  16    Date for PT Re-Evaluation  02/04/18    PT Start Time  1300    PT Stop Time  1344    PT Time Calculation (min)  44 min    Activity Tolerance  Patient tolerated treatment well    Behavior During Therapy  [facility] for tasks assessed/performed       Past Medical History:  Diagnosis Date  . E. coli infection   . History of appendectomy     Past Surgical History:  Procedure Laterality Date  . cardiac catherization    . GASTRIC BYPASS    . upper blepharoplasty      There were no vitals filed for this visit.  Subjective Assessment - 01/29/18 1305    Subjective  Patient reports no back pain at start of session. States he has not had any falls since last session.    Pertinent History  Mr. Mciver is a right handed 67 y.o. male retiree with history of diabetes, hypertension , here for evaluation of lower extremity weakness. Patient reports weakness in legs began in December last year. 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 No headaches, no dizziness, no numbness of his legs, no back problems, has never had back surgery. Only recent thing is going on nitrofurantoin twice daily for persistent UTI. Looking for CIDV.     Limitations  Standing;Walking;House hold activities;Other (comment)    Currently in Pain?  No/denies       Nustep Lvl 4 RPM>60 for cardiovascular support: SPM 80-90   Supine : LE rotation to improve lumbar mobility x 2 minutes Bridges 10x, 2 sets; cues for gluteal squeeze  during concentric and eccentric portion, cues for slowed lowering  Straight leg raise 10x each leg with opp knee bent.  Posterior pelvic tilts 2x 10x 4 second holds with initial tactile cues for posterior pelvic tilt, verbal cues for breathing S/l clamshells with PT resistance x10 reps x5 reps  Seated: Marble pick up 10x to L and R each set for foot and ankle stability/strength ; patient requires cueing for decreasing velocity to improve coordination. R foot with fair control, L foot with fair-good control  RTB eversion 10x each LE with PT assist with theraband RTB inversion 10x each LE with PT assist with theraband  See visit on 01/31/2018 for updated goals.   PT Education - 01/29/18 1356    Education Details  exercise technique, proper pacing of activities    Person(s) Educated  Patient    Methods  Explanation;Tactile cues;Verbal cues    Comprehension  Verbalized understanding;Returned demonstration;Tactile cues required       PT Short Term Goals - 12/10/17 1811      PT SHORT TERM GOAL #1   Title  Patient will be independent in home exercise program to improve strength/mobility for better functional independence with ADLs.    Baseline  HEP given     Time  2    Period  Weeks  Status  New    Target Date  12/24/17      PT SHORT TERM GOAL #2   Title  Patient will deny any falls over past 2 weeks to demonstrate improved safety awareness at home and work.     Baseline  falls 1-2x/week     Time  2    Period  Weeks    Status  Ongoing   Target Date  12/24/17        PT Long Term Goals - 12/10/17 1813      PT LONG TERM GOAL #1   Title  Patient will deny any falls over past 4 weeks to demonstrate improved safety awareness at home and work.     Baseline  falls 1-2x/week     Time  8    Period  Weeks    Status Ongoing    Target Date  02/04/18      PT LONG TERM GOAL #2   Title  Patient will increase dynamic gait index score to >19/24 as to demonstrate reduced fall risk and  improved dynamic gait balance for better safety with community/home ambulation.     Baseline  8/26: 11/24    Time  8    Period  Weeks    Status  Ongoing   Target Date  02/04/18      PT LONG TERM GOAL #3   Title  Patient will increase BLE gross strength to 4+/5 as to improve functional strength for independent gait, increased standing tolerance and increased ADL ability.    Baseline  8/26" unable to perform standing heel raise    Time  8    Period  Weeks    Status  Ongoing    Target Date  02/04/18      PT LONG TERM GOAL #4   Title  Patient will increase lower extremity functional scale to >50/80 to demonstrate improved functional mobility and increased tolerance with ADLs.     Baseline  8/26: 27/80     Time  8    Period  Weeks    Status  Ongoing   Target Date  02/04/18            Plan - 01/29/18 1359    Clinical Impression Statement  Patient without complaint with low back strengthening and mobility activities, needs verbal/tactile cueing for proper form/technique with good carry over with repetition. Most difficulty with ankle and foot exercises, increased time neede to optimize movement quality and performance, as well as verbal and demonstration from PT.   Overall the patient demonstrated limitations in strength, endurance, and balance that impede the patient's functional abilities, safety, and mobility and would benefit from skilled PT intervention.     PT Frequency  2x / week    PT Duration  8 weeks    PT Treatment/Interventions  ADLs/Self Care Home Management;Aquatic Therapy;Cryotherapy;Ultrasound;Traction;Moist Heat;Electrical Stimulation;DME Instruction;Gait training;Stair training;Functional mobility training;Neuromuscular re-education;Balance training;Therapeutic exercise;Therapeutic activities;Patient/family education;Orthotic Fit/Training;Manual techniques;Dry needling;Passive range of motion;Compression bandaging;Manual lymph drainage;Energy  conservation;Splinting;Taping;Vasopneumatic Device;Visual/perceptual remediation/compensation;Vestibular    Consulted and Agree with Plan of Care  Patient;Family member/caregiver    Family Member Consulted  wife       Patient will benefit from skilled therapeutic intervention in order to improve the following deficits and impairments:  Abnormal gait, Cardiopulmonary status limiting activity, Decreased activity tolerance, Decreased balance, Decreased knowledge of precautions, Decreased endurance, Decreased mobility, Difficulty walking, Decreased strength, Increased edema, Impaired flexibility, Impaired perceived functional ability, Postural dysfunction, Improper body mechanics  Visit Diagnosis:  Muscle weakness (generalized)  Other abnormalities of gait and mobility     Problem List There are no active problems to display for this patient.  Olga Coaster PT, DPT [phone] PM,01/29/18 [phone]   [facility] MAIN [facility] SERVICES [address] [place], [place], [address] Phone: [phone]   Fax:  [phone]  Name: IKER NUTTALL MRN: [id number] Date of Birth: 11/06/1950

## 2018-01-31 ENCOUNTER — Ambulatory Visit: Payer: Medicare Other

## 2018-01-31 DIAGNOSIS — M6281 Muscle weakness (generalized): Secondary | ICD-10-CM

## 2018-01-31 DIAGNOSIS — R2689 Other abnormalities of gait and mobility: Secondary | ICD-10-CM

## 2018-01-31 NOTE — Therapy (Addendum)
[place] MAIN [facility] SERVICES [address] [place], [place], [address] Phone: [phone]   Fax:  [phone]  Physical Therapy Treatment   Patient Details  Name: Nathan [name] MRN: [id number] Date of Birth: March 27, 1951 No data recorded  Encounter Date: 01/31/2018  PT End of Session - 01/31/18 1311    Visit Number  11    Number of Visits  32    Date for PT Re-Evaluation  03/28/18    Authorization Type  10/10 start 1/10 10/17 next session     PT Start Time  1304    PT Stop Time  1345    PT Time Calculation (min)  41 min    Activity Tolerance  Patient tolerated treatment well    Behavior During Therapy  [facility] for tasks assessed/performed       Past Medical History:  Diagnosis Date  . E. coli infection   . History of appendectomy     Past Surgical History:  Procedure Laterality Date  . cardiac catherization    . GASTRIC BYPASS    . upper blepharoplasty      There were no vitals filed for this visit.  Subjective Assessment - 01/31/18 1310    Subjective  Patient reports he is heading to [place] on the 31st. Has been compliant with HEP. Is feeling very fatigued today so feeling a little more off balance.     Pertinent History  Nathan [name] is a right handed 67 y.o. male retiree with history of diabetes, hypertension , here for evaluation of lower extremity weakness. Patient reports weakness in legs began in December last year. 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 No headaches, no dizziness, no numbness of his legs, no back problems, has never had back surgery. Only recent thing is going on nitrofurantoin twice daily for persistent UTI. Looking for CIDV.     Limitations  Standing;Walking;House hold activities;Other (comment)    Currently in Pain?  No/denies      falls: 0 falls  DGI:12/24 BLE strength: 4/5 LEFS: 32/80   STS Perform 4/5 STS with arms crossed without LOB: unable to perform performs  2x without LOB, excessive posterior lean  Muscle activation of calf when attempt calf raise, able to lift 0.5 inch off ground with BUE support  Stand with 3lb bar  Straight arm raises 10x in // bars  Horizontal twists with 3lb arm in // bars 10x  Standing balloon taps in // bars 2 minutes reaching inside and outside BOS   Forward backwards rocking 10x each leg in // bars BUE support   [facility] PT Assessment - 01/31/18 0001      Dynamic Gait Index   Level Surface  Mild Impairment    Change in Gait Speed  Mild Impairment    Gait with Horizontal Head Turns  Moderate Impairment    Gait with Vertical Head Turns  Moderate Impairment    Gait and Pivot Turn  Moderate Impairment    Step Over Obstacle  Moderate Impairment    Step Around Obstacles  Mild Impairment    Steps  Mild Impairment    Total Score  12        Patient's condition has the potential to improve in response to therapy. Maximum improvement is yet to be obtained. The anticipated improvement is attainable and reasonable in a generally predictable time.  Patient reports he can stand still in the yard while holding the puppy dogs leashes  now.                       PT Education - 01/31/18 1310    Education Details  exercise technique, goal progression, POC    Person(s) Educated  Patient    Methods  Explanation;Demonstration;Verbal cues    Comprehension  Verbalized understanding;Returned demonstration;Verbal cues required       PT Short Term Goals - 01/31/18 1323      PT SHORT TERM GOAL #1   Title  Patient will be independent in home exercise program to improve strength/mobility for better functional independence with ADLs.    Baseline  HEP compliant    Time  2    Period  Weeks    Status  Achieved      PT SHORT TERM GOAL #2   Title  Patient will deny any falls over past 2 weeks to demonstrate improved safety awareness at home and work.     Baseline  falls 1-2x/week ; 10/17: no falls    Time  2     Period  Weeks    Status  Achieved        PT Long Term Goals - 01/31/18 1324      PT LONG TERM GOAL #1   Title  Patient will deny any falls over past 4 weeks to demonstrate improved safety awareness at home and work.     Baseline  falls 1-2x/week  10/17: no falls    Time  8    Period  Weeks    Status  Achieved      PT LONG TERM GOAL #2   Title  Patient will increase dynamic gait index score to >19/24 as to demonstrate reduced fall risk and improved dynamic gait balance for better safety with community/home ambulation.     Baseline  8/26: 11/24    Time  8    Period  Weeks    Status  New      PT LONG TERM GOAL #3   Title  Patient will increase BLE gross strength to 4+/5 as to improve functional strength for independent gait, increased standing tolerance and increased ADL ability.    Baseline  8/26" unable to perform standing heel raise 10/17: 4/5 except for PF    Time  8    Period  Weeks    Status  Partially Met    Target Date  03/28/18      PT LONG TERM GOAL #4   Title  Patient will increase lower extremity functional scale to >50/80 to demonstrate improved functional mobility and increased tolerance with ADLs.     Baseline  8/26: 27/80 10/17: 32/80     Time  8    Period  Weeks    Status  Partially Met    Target Date  03/28/18      PT LONG TERM GOAL #5   Title  Patient will perform 4/5 sit to stand transitions without UE support and without loss of balance to reduce instability during transitions an dimprove patient comfort in public places such as restaurants.     Baseline  10/17: 2/5 without LOB; excessive posterior trunk     Time  8    Period  Weeks    Status  New    Target Date  03/28/18            Plan - 01/31/18 1500    Clinical Impression Statement  Patient presents with fatigue today limiting his  stability, this potentially reduced his DGI score as he has demonstrated the ability to perform activities better in previous sessions. LE strength has improved  with continued limitation of plantarflexion from body habitus.  Patient's balance has progressed with no reported falls in the past few weeks. LEFS has improved and hip strength improved as well. Patient's condition has the potential to improve in response to therapy. Maximum improvement is yet to be obtained. The anticipated improvement is attainable and reasonable in a generally predictable time. Patient will continue to benefit from skilled therapy to improve strength, balance, and stability to decrease fall risk with ambulation    PT Frequency  2x / week    PT Duration  8 weeks    PT Treatment/Interventions  ADLs/Self Care Home Management;Aquatic Therapy;Cryotherapy;Ultrasound;Traction;Moist Heat;Electrical Stimulation;DME Instruction;Gait training;Stair training;Functional mobility training;Neuromuscular re-education;Balance training;Therapeutic exercise;Therapeutic activities;Patient/family education;Orthotic Fit/Training;Manual techniques;Dry needling;Passive range of motion;Compression bandaging;Manual lymph drainage;Energy conservation;Splinting;Taping;Vasopneumatic Device;Visual/perceptual remediation/compensation;Vestibular    Consulted and Agree with Plan of Care  Patient;Family member/caregiver    Family Member Consulted  wife       Patient will benefit from skilled therapeutic intervention in order to improve the following deficits and impairments:  Abnormal gait, Cardiopulmonary status limiting activity, Decreased activity tolerance, Decreased balance, Decreased knowledge of precautions, Decreased endurance, Decreased mobility, Difficulty walking, Decreased strength, Increased edema, Impaired flexibility, Impaired perceived functional ability, Postural dysfunction, Improper body mechanics  Visit Diagnosis: Muscle weakness (generalized)  Other abnormalities of gait and mobility     Problem List There are no active problems to display for this patient.   Janna Arch, PT,  DPT   01/31/2018, 3:03 PM  [place] MAIN [facility] SERVICES [address] [place], [place], [address] Phone: [phone]   Fax:  [phone]  Name: RYLE BUSCEMI MRN: [id number] Date of Birth: 1950/08/06

## 2018-02-05 ENCOUNTER — Ambulatory Visit: Payer: Medicare Other

## 2018-02-05 DIAGNOSIS — M6281 Muscle weakness (generalized): Secondary | ICD-10-CM | POA: Diagnosis not present

## 2018-02-05 DIAGNOSIS — R2689 Other abnormalities of gait and mobility: Secondary | ICD-10-CM

## 2018-02-05 NOTE — Therapy (Signed)
[place] MAIN [facility] SERVICES [address] [place], [place], [address] Phone: [phone]   Fax:  [phone]  Physical Therapy Treatment  Patient Details  Name: Nathan [name] MRN: [id number] Date of Birth: 01/29/1951 No data recorded  Encounter Date: 02/05/2018  PT End of Session - 02/05/18 1307    Visit Number  12    Number of Visits  32    Date for PT Re-Evaluation  03/28/18    Authorization Type  1/10 10/17 next session     PT Start Time  1302    PT Stop Time  1346    PT Time Calculation (min)  44 min    Activity Tolerance  Patient tolerated treatment well    Behavior During Therapy  [facility] for tasks assessed/performed       Past Medical History:  Diagnosis Date  . E. coli infection   . History of appendectomy     Past Surgical History:  Procedure Laterality Date  . cardiac catherization    . GASTRIC BYPASS    . upper blepharoplasty      There were no vitals filed for this visit.  Subjective Assessment - 02/05/18 1305    Subjective  Patient reports feeling no dizziness or falls since last session. Patient excited about heading to [place]. Has been compliant with HEP.     Pertinent History  [name] is a right handed 67 y.o. male retiree with history of diabetes, hypertension , here for evaluation of lower extremity weakness. Patient reports weakness in legs began in December last year. 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 No headaches, no dizziness, no numbness of his legs, no back problems, has never had back surgery. Only recent thing is going on nitrofurantoin twice daily for persistent UTI. Looking for CIDV.     Limitations  Standing;Walking;House hold activities;Other (comment)    Currently in Pain?  No/denies         Nustep Lvl 4 RPM>60 for cardiovascular support: SPM 80-90    Standing: Stand with 3lb bar  Straight arm raises 15x in // bars   Horizontal twists with 3lb arm  in // bars 15x; more more LOB when twisting to the R   Standing balloon taps in // bars 2 minutes reaching inside and outside BOS    Forward backwards rocking 10x each leg in // bars BUE support  Airex pad:    Hold ball no UE support 2x30 seconds ; no LOB once holding ball  Tandem stance: one foot on each airex pad: 2x30 second holds each LE,  Occasionally requiring  UE support  Stance without airex pad: to practice standing in line in [place].    RTB eversion 10x each LE with PT assist with theraband RTB inversion 10x each LE with PT assist with theraband RTB df 10x each LE with PT assist with theraband                       PT Education - 02/05/18 1306    Education Details  exercise technique, stability,     Person(s) Educated  Patient    Methods  Explanation;Demonstration;Verbal cues    Comprehension  Verbalized understanding;Returned demonstration       PT Short Term Goals - 01/31/18 1323      PT SHORT TERM GOAL #1   Title  Patient will be independent in home exercise program to improve strength/mobility for better  functional independence with ADLs.    Baseline  HEP compliant    Time  2    Period  Weeks    Status  Achieved      PT SHORT TERM GOAL #2   Title  Patient will deny any falls over past 2 weeks to demonstrate improved safety awareness at home and work.     Baseline  falls 1-2x/week ; 10/17: no falls    Time  2    Period  Weeks    Status  Achieved        PT Long Term Goals - 01/31/18 1324      PT LONG TERM GOAL #1   Title  Patient will deny any falls over past 4 weeks to demonstrate improved safety awareness at home and work.     Baseline  falls 1-2x/week  10/17: no falls    Time  8    Period  Weeks    Status  Achieved      PT LONG TERM GOAL #2   Title  Patient will increase dynamic gait index score to >19/24 as to demonstrate reduced fall risk and improved dynamic gait balance for better safety with community/home ambulation.      Baseline  8/26: 11/24    Time  8    Period  Weeks    Status  New      PT LONG TERM GOAL #3   Title  Patient will increase BLE gross strength to 4+/5 as to improve functional strength for independent gait, increased standing tolerance and increased ADL ability.    Baseline  8/26" unable to perform standing heel raise 10/17: 4/5 except for PF    Time  8    Period  Weeks    Status  Partially Met    Target Date  03/28/18      PT LONG TERM GOAL #4   Title  Patient will increase lower extremity functional scale to >50/80 to demonstrate improved functional mobility and increased tolerance with ADLs.     Baseline  8/26: 27/80 10/17: 32/80     Time  8    Period  Weeks    Status  Partially Met    Target Date  03/28/18      PT LONG TERM GOAL #5   Title  Patient will perform 4/5 sit to stand transitions without UE support and without loss of balance to reduce instability during transitions an dimprove patient comfort in public places such as restaurants.     Baseline  10/17: 2/5 without LOB; excessive posterior trunk     Time  8    Period  Weeks    Status  New    Target Date  03/28/18            Plan - 02/05/18 1358    Clinical Impression Statement  Patient demonstrated improved static stability on stable and unstable surfaces with decreased episodes of LOB. Tandem stance continues to challenge patient with limited ankle stability resulting in excessive trunk sway and use of UE's to retain COM. Patient will continue to benefit from skilled therapy to improve strength, balance, and stability to decrease fall risk with ambulation    PT Frequency  2x / week    PT Duration  8 weeks    PT Treatment/Interventions  ADLs/Self Care Home Management;Aquatic Therapy;Cryotherapy;Ultrasound;Traction;Moist Heat;Electrical Stimulation;DME Instruction;Gait training;Stair training;Functional mobility training;Neuromuscular re-education;Balance training;Therapeutic exercise;Therapeutic  activities;Patient/family education;Orthotic Fit/Training;Manual techniques;Dry needling;Passive range of motion;Compression bandaging;Manual lymph drainage;Energy conservation;Splinting;Taping;Vasopneumatic Device;Visual/perceptual remediation/compensation;Vestibular  Consulted and Agree with Plan of Care  Patient;Family member/caregiver    Family Member Consulted  wife       Patient will benefit from skilled therapeutic intervention in order to improve the following deficits and impairments:  Abnormal gait, Cardiopulmonary status limiting activity, Decreased activity tolerance, Decreased balance, Decreased knowledge of precautions, Decreased endurance, Decreased mobility, Difficulty walking, Decreased strength, Increased edema, Impaired flexibility, Impaired perceived functional ability, Postural dysfunction, Improper body mechanics  Visit Diagnosis: Muscle weakness (generalized)  Other abnormalities of gait and mobility     Problem List There are no active problems to display for this patient.  Janna Arch, PT, DPT   02/05/2018, 1:59 PM  [place] MAIN [facility] SERVICES [address] [place], [place], [address] Phone: [phone]   Fax:  [phone]  Name: HARBERT FITTERER MRN: [id number] Date of Birth: December 21, 1950

## 2018-02-07 ENCOUNTER — Ambulatory Visit: Payer: Medicare Other

## 2018-02-07 DIAGNOSIS — M6281 Muscle weakness (generalized): Secondary | ICD-10-CM

## 2018-02-07 DIAGNOSIS — R2689 Other abnormalities of gait and mobility: Secondary | ICD-10-CM

## 2018-02-07 NOTE — Therapy (Signed)
[place] MAIN [facility] SERVICES [address] [place], [place], [address] Phone: [phone]   Fax:  [phone]  Physical Therapy Treatment  Patient Details  Name: Nathan [name] MRN: [id number] Date of Birth: March 26, 1951 No data recorded  Encounter Date: 02/07/2018  PT End of Session - 02/07/18 0949    Visit Number  13    Number of Visits  32    Date for PT Re-Evaluation  03/28/18    Authorization Type  2/10 10/17 next session     PT Start Time  0930    PT Stop Time  2248    PT Time Calculation (min)  44 min    Activity Tolerance  Patient tolerated treatment well    Behavior During Therapy  [facility] for tasks assessed/performed       Past Medical History:  Diagnosis Date  . E. coli infection   . History of appendectomy     Past Surgical History:  Procedure Laterality Date  . cardiac catherization    . GASTRIC BYPASS    . upper blepharoplasty      There were no vitals filed for this visit.  Subjective Assessment - 02/07/18 0948    Subjective  Patient reports feeling a little tired, got his shots two days ago. Patient reports no LOB or falls since last session, compliant with HEP. Patient reports he normally is sleeping at this time this morning.     Pertinent History  Mr. Looper is a right handed 67 y.o. male retiree with history of diabetes, hypertension , here for evaluation of lower extremity weakness. Patient reports weakness in legs began in December last year. 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 No headaches, no dizziness, no numbness of his legs, no back problems, has never had back surgery. Only recent thing is going on nitrofurantoin twice daily for persistent UTI. Looking for CIDV.     Limitations  Standing;Walking;House hold activities;Other (comment)    Currently in Pain?  No/denies           Nustep Lvl 4 RPM>60 for cardiovascular support: SPM 80-90    Standing:  6" step toe  taps BUE support; 10x each LE  Red light green light stop intervention: CGA in // bars frequent LOB with sudden stop; excessive trunk LOB, SUE when ambulating backwards. Decreased LOB when verbal cued for upright posture.    Standing balloon taps in // bars 2 minutes reaching inside and outside BOS      Airex pad:                            Tandem stance: one foot on each airex pad: 2x30 second holds each LE,  Occasionally requiring  UE support  Airex pad : step over orange hurdle onto second airex pad SUE support  : 10x each leg    Seated: Gluteal squeezes 10x: 5 second holds  RTB eversion 10x each LE with PT assist with theraband RTB inversion 10x each LE with PT assist with theraband RTB df 10x each LE with PT assist with theraband                        PT Education - 02/07/18 0949    Education Details  exercise technique, stability     Person(s) Educated  Patient    Methods  Explanation;Demonstration;Verbal cues    Comprehension  Verbalized understanding;Returned demonstration       PT Short Term Goals - 01/31/18 1323      PT SHORT TERM GOAL #1   Title  Patient will be independent in home exercise program to improve strength/mobility for better functional independence with ADLs.    Baseline  HEP compliant    Time  2    Period  Weeks    Status  Achieved      PT SHORT TERM GOAL #2   Title  Patient will deny any falls over past 2 weeks to demonstrate improved safety awareness at home and work.     Baseline  falls 1-2x/week ; 10/17: no falls    Time  2    Period  Weeks    Status  Achieved        PT Long Term Goals - 01/31/18 1324      PT LONG TERM GOAL #1   Title  Patient will deny any falls over past 4 weeks to demonstrate improved safety awareness at home and work.     Baseline  falls 1-2x/week  10/17: no falls    Time  8    Period  Weeks    Status  Achieved      PT LONG TERM GOAL #2   Title  Patient will increase dynamic gait index score  to >19/24 as to demonstrate reduced fall risk and improved dynamic gait balance for better safety with community/home ambulation.     Baseline  8/26: 11/24    Time  8    Period  Weeks    Status  New      PT LONG TERM GOAL #3   Title  Patient will increase BLE gross strength to 4+/5 as to improve functional strength for independent gait, increased standing tolerance and increased ADL ability.    Baseline  8/26" unable to perform standing heel raise 10/17: 4/5 except for PF    Time  8    Period  Weeks    Status  Partially Met    Target Date  03/28/18      PT LONG TERM GOAL #4   Title  Patient will increase lower extremity functional scale to >50/80 to demonstrate improved functional mobility and increased tolerance with ADLs.     Baseline  8/26: 27/80 10/17: 32/80     Time  8    Period  Weeks    Status  Partially Met    Target Date  03/28/18      PT LONG TERM GOAL #5   Title  Patient will perform 4/5 sit to stand transitions without UE support and without loss of balance to reduce instability during transitions an dimprove patient comfort in public places such as restaurants.     Baseline  10/17: 2/5 without LOB; excessive posterior trunk     Time  8    Period  Weeks    Status  New    Target Date  03/28/18            Plan - 02/07/18 1007    Clinical Impression Statement  Patient challenged with initiation and termination of tasks frequently resulting in LOB due to excessive trunk flexion. Limited ankle stability and responses noted with poor stability on unstable and stable surfaces. Patient will continue to benefit from skilled therapy to improve strength, balance, and stability to decrease fall risk with ambulation    PT Frequency  2x / week    PT Duration  8  weeks    PT Treatment/Interventions  ADLs/Self Care Home Management;Aquatic Therapy;Cryotherapy;Ultrasound;Traction;Moist Heat;Electrical Stimulation;DME Instruction;Gait training;Stair training;Functional mobility  training;Neuromuscular re-education;Balance training;Therapeutic exercise;Therapeutic activities;Patient/family education;Orthotic Fit/Training;Manual techniques;Dry needling;Passive range of motion;Compression bandaging;Manual lymph drainage;Energy conservation;Splinting;Taping;Vasopneumatic Device;Visual/perceptual remediation/compensation;Vestibular    Consulted and Agree with Plan of Care  Patient;Family member/caregiver    Family Member Consulted  wife       Patient will benefit from skilled therapeutic intervention in order to improve the following deficits and impairments:  Abnormal gait, Cardiopulmonary status limiting activity, Decreased activity tolerance, Decreased balance, Decreased knowledge of precautions, Decreased endurance, Decreased mobility, Difficulty walking, Decreased strength, Increased edema, Impaired flexibility, Impaired perceived functional ability, Postural dysfunction, Improper body mechanics  Visit Diagnosis: Muscle weakness (generalized)  Other abnormalities of gait and mobility     Problem List There are no active problems to display for this patient.  Janna Arch, PT, DPT   02/07/2018, 10:34 AM  [place] MAIN [facility] SERVICES [address] [place], [place], [address] Phone: [phone]   Fax:  [phone]  Name: Nathan [name] MRN: [id number] Date of Birth: 08/21/1950

## 2018-02-12 ENCOUNTER — Ambulatory Visit: Payer: Medicare Other

## 2018-02-14 ENCOUNTER — Ambulatory Visit: Payer: Medicare Other

## 2018-02-18 ENCOUNTER — Ambulatory Visit: Payer: Medicare Other

## 2018-02-20 ENCOUNTER — Ambulatory Visit: Payer: Medicare Other

## 2018-02-25 ENCOUNTER — Ambulatory Visit: Payer: Medicare Other

## 2018-02-27 ENCOUNTER — Ambulatory Visit: Payer: Medicare Other | Attending: Internal Medicine

## 2018-02-27 DIAGNOSIS — M6281 Muscle weakness (generalized): Secondary | ICD-10-CM | POA: Diagnosis present

## 2018-02-27 DIAGNOSIS — R2689 Other abnormalities of gait and mobility: Secondary | ICD-10-CM | POA: Diagnosis present

## 2018-02-27 NOTE — Therapy (Addendum)
[place] MAIN [facility] SERVICES [address] [place], [place], [address] Phone: [phone]   Fax:  [phone]  Physical Therapy Treatment  Patient Details  Name: Nathan [name] MRN: [id number] Date of Birth: Jan 01, 1951 No data recorded  Encounter Date: 02/27/2018  PT End of Session - 02/27/18 0947    Visit Number  14    Number of Visits  32    Date for PT Re-Evaluation  03/28/18    Authorization Type  3/10 10/17 next session     PT Start Time  0903    PT Stop Time  0945    PT Time Calculation (min)  42 min    Equipment Utilized During Treatment  Gait belt    Activity Tolerance  Patient tolerated treatment well    Behavior During Therapy  [facility] for tasks assessed/performed       Past Medical History:  Diagnosis Date  . E. coli infection   . History of appendectomy     Past Surgical History:  Procedure Laterality Date  . cardiac catherization    . GASTRIC BYPASS    . upper blepharoplasty      There were no vitals filed for this visit.  Subjective Assessment - 02/27/18 0909    Subjective  Patient reports he had good trip to [place]. States he has fallen twice since last visit. Once was in airport walking towards gate and one time at [employer]. He was not using his cane. Reports he went to see neurologist on Monday regarding recent falls but imaging was cleared. States doctor wants him using walker.      Pertinent History  [name] is a right handed 67 y.o. male retiree with history of diabetes, hypertension , here for evaluation of lower extremity weakness. Patient reports weakness in legs began in December last year. 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 No headaches, no dizziness, no numbness of his legs, no back problems, has never had back surgery. Only recent thing is going on nitrofurantoin twice daily for persistent UTI. Looking for CIDV.     Limitations  Standing;Walking;House hold  activities;Other (comment)    Currently in Pain?  No/denies             Nustep Lvl 5 42mnutes RPM>60 for cardiovascular support: SPM 80-90     Standing: Ambulate 2x length of //bars without UE support with DF assist of BTB to allow for adaptation.     Red light green light stop intervention: CGA in // bars frequent LOB with sudden stop; excessive trunk LOB, SUE when ambulating backwards. Occasional LOB when terminating movement.    Standing balloon taps in // bars 2 minutes reaching inside and outside BOS    Airex pad:    6" step toe taps on airex pad 1 UE support; 10x each LE   6" lateral toe taps on airex pad 1 UE 10x each LE             Airex pad : step over orange hurdle onto second airex pad SUE support  : 10x each leg    Seated: RTB eversion 10x each LE with PT assist with theraband RTB inversion 10x each LE with PT assist with theraband     Bilateral DF assist with BTB used throughout session.                  PT Education - 02/27/18 0914    Education Details  exercise technique, stability, AFOs    Person(s) Educated  Patient    Methods  Explanation;Demonstration;Verbal cues    Comprehension  Verbalized understanding;Returned demonstration       PT Short Term Goals - 01/31/18 1323      PT SHORT TERM GOAL #1   Title  Patient will be independent in home exercise program to improve strength/mobility for better functional independence with ADLs.    Baseline  HEP compliant    Time  2    Period  Weeks    Status  Achieved      PT SHORT TERM GOAL #2   Title  Patient will deny any falls over past 2 weeks to demonstrate improved safety awareness at home and work.     Baseline  falls 1-2x/week ; 10/17: no falls    Time  2    Period  Weeks    Status  Achieved        PT Long Term Goals - 01/31/18 1324      PT LONG TERM GOAL #1   Title  Patient will deny any falls over past 4 weeks to demonstrate improved safety awareness at home and work.      Baseline  falls 1-2x/week  10/17: no falls    Time  8    Period  Weeks    Status  Achieved      PT LONG TERM GOAL #2   Title  Patient will increase dynamic gait index score to >19/24 as to demonstrate reduced fall risk and improved dynamic gait balance for better safety with community/home ambulation.     Baseline  8/26: 11/24    Time  8    Period  Weeks    Status  New      PT LONG TERM GOAL #3   Title  Patient will increase BLE gross strength to 4+/5 as to improve functional strength for independent gait, increased standing tolerance and increased ADL ability.    Baseline  8/26" unable to perform standing heel raise 10/17: 4/5 except for PF    Time  8    Period  Weeks    Status  Partially Met    Target Date  03/28/18      PT LONG TERM GOAL #4   Title  Patient will increase lower extremity functional scale to >50/80 to demonstrate improved functional mobility and increased tolerance with ADLs.     Baseline  8/26: 27/80 10/17: 32/80     Time  8    Period  Weeks    Status  Partially Met    Target Date  03/28/18      PT LONG TERM GOAL #5   Title  Patient will perform 4/5 sit to stand transitions without UE support and without loss of balance to reduce instability during transitions an dimprove patient comfort in public places such as restaurants.     Baseline  10/17: 2/5 without LOB; excessive posterior trunk     Time  8    Period  Weeks    Status  New    Target Date  03/28/18            Plan - 02/27/18 1134    Clinical Impression Statement  Patient returns to therapy following [place] trip and recent falls. Patient was not using assistive device during either fall and was advised by doctor to begin using a walker. Entire session was completed with bilateral dorsiflexion assist to improve foot clearance and gait mechanics  which could possibly be leading to increased falls. Patient had improved heel contact and foot clearance with use of dorsiflexion assist and may be benefited  from trial of AFOs to promote foot clearance and decrease fall risk. Patient still has difficulty with initiating and terminating movement evident by frequent LOB. Patient will continue to benefit from skilled physical therapy to improve strength, balance, and stability to decrease fall risk with ambulation.     PT Frequency  2x / week    PT Duration  8 weeks    PT Treatment/Interventions  ADLs/Self Care Home Management;Aquatic Therapy;Cryotherapy;Ultrasound;Traction;Moist Heat;Electrical Stimulation;DME Instruction;Gait training;Stair training;Functional mobility training;Neuromuscular re-education;Balance training;Therapeutic exercise;Therapeutic activities;Patient/family education;Orthotic Fit/Training;Manual techniques;Dry needling;Passive range of motion;Compression bandaging;Manual lymph drainage;Energy conservation;Splinting;Taping;Vasopneumatic Device;Visual/perceptual remediation/compensation;Vestibular    Consulted and Agree with Plan of Care  Patient;Family member/caregiver    Family Member Consulted  wife       Patient will benefit from skilled therapeutic intervention in order to improve the following deficits and impairments:  Abnormal gait, Cardiopulmonary status limiting activity, Decreased activity tolerance, Decreased balance, Decreased knowledge of precautions, Decreased endurance, Decreased mobility, Difficulty walking, Decreased strength, Increased edema, Impaired flexibility, Impaired perceived functional ability, Postural dysfunction, Improper body mechanics  Visit Diagnosis: Muscle weakness (generalized)  Other abnormalities of gait and mobility     Problem List There are no active problems to display for this patient.  Erick Blinks, SPT  This entire session was performed under direct supervision and direction of a licensed therapist/therapist assistant . I have personally read, edited and approve of the note as written.  Janna Arch, PT, DPT   02/27/2018, 11:38  AM  [place] MAIN [facility] SERVICES [address] [place], [place], [address] Phone: [phone]   Fax:  [phone]  Name: ESTEPHAN GALLARDO MRN: [id number] Date of Birth: 09/28/1950

## 2018-03-05 ENCOUNTER — Ambulatory Visit: Payer: Medicare Other

## 2018-03-05 DIAGNOSIS — M6281 Muscle weakness (generalized): Secondary | ICD-10-CM

## 2018-03-05 DIAGNOSIS — R2689 Other abnormalities of gait and mobility: Secondary | ICD-10-CM

## 2018-03-05 NOTE — Therapy (Signed)
[place] MAIN [facility] SERVICES [address] [place], [place], [address] Phone: [phone]   Fax:  [phone]  Physical Therapy Treatment  Patient Details  Name: Nathan [name] MRN: [id number] Date of Birth: [birth year] No data recorded  Encounter Date: 03/05/2018  PT End of Session - 03/05/18 1529    Visit Number  15    Number of Visits  32    Date for PT Re-Evaluation  03/28/18    Authorization Type  4/10 10/17 next session     PT Start Time  1431    PT Stop Time  1515    PT Time Calculation (min)  44 min    Equipment Utilized During Treatment  Gait belt    Activity Tolerance  Patient tolerated treatment well    Behavior During Therapy  [place] for tasks assessed/performed       Past Medical History:  Diagnosis Date  . E. coli infection   . History of appendectomy     Past Surgical History:  Procedure Laterality Date  . cardiac catherization    . GASTRIC BYPASS    . upper blepharoplasty      There were no vitals filed for this visit.  Subjective Assessment - 03/05/18 1528    Subjective  Patient reports he has not had any falls since last session. His wife is getting surgery on Thursday. Patient is having difficulty obtaining a walker due to a mixup at the store he was at.     Pertinent History  Nathan [name] is a right handed 67 y.o. male retiree with history of diabetes, hypertension , here for evaluation of lower extremity weakness. Patient reports weakness in legs began in December last year. 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 No headaches, no dizziness, no numbness of his legs, no back problems, has never had back surgery. Only recent thing is going on nitrofurantoin twice daily for persistent UTI. Looking for CIDV.     Limitations  Standing;Walking;House hold activities;Other (comment)    Currently in Pain?  No/denies        Nustep Lvl 5 10mnutes RPM>60 for cardiovascular support: SPM  80-90  Standing:    Red light green light stop intervention: CGA in // bars frequent LOB with sudden stop; excessive trunk LOB, SUE when ambulating backwards. Occasional LOB when terminating movement  One foot on dynadisc standing balloon taps in // bars; 10x each LE reaching inside and outside BOS   Speed ladder:   One foot in each box, heel strike with SUE support. Verbal cues for widening BOS and positioning. 8x length of bars   Side step into each box SUE support 8x each LE  Airex pad: 6 " step step ups SUE support 10x each LE  Airex pad: side step 6" step ups SUE support 10x each LE.    Seated: RTB eversion 10x each LEwith PT assist with theraband RTB inversion 10x each LEwith PT assist with theraband RTB df 10x each LE RTB pf 10x each LE    RTB hamstring curl 10x each LE                 PT Education - 03/05/18 1529    Education Details  exercise technique, stability     Person(s) Educated  Patient    Methods  Explanation;Demonstration;Verbal cues    Comprehension  Verbalized understanding;Returned demonstration       PT Short Term Goals - 01/31/18 1323  PT SHORT TERM GOAL #1   Title  Patient will be independent in home exercise program to improve strength/mobility for better functional independence with ADLs.    Baseline  HEP compliant    Time  2    Period  Weeks    Status  Achieved      PT SHORT TERM GOAL #2   Title  Patient will deny any falls over past 2 weeks to demonstrate improved safety awareness at home and work.     Baseline  falls 1-2x/week ; 10/17: no falls    Time  2    Period  Weeks    Status  Achieved        PT Long Term Goals - 01/31/18 1324      PT LONG TERM GOAL #1   Title  Patient will deny any falls over past 4 weeks to demonstrate improved safety awareness at home and work.     Baseline  falls 1-2x/week  10/17: no falls    Time  8    Period  Weeks    Status  Achieved      PT LONG TERM GOAL #2   Title   Patient will increase dynamic gait index score to >19/24 as to demonstrate reduced fall risk and improved dynamic gait balance for better safety with community/home ambulation.     Baseline  8/26: 11/24    Time  8    Period  Weeks    Status  New      PT LONG TERM GOAL #3   Title  Patient will increase BLE gross strength to 4+/5 as to improve functional strength for independent gait, increased standing tolerance and increased ADL ability.    Baseline  8/26" unable to perform standing heel raise 10/17: 4/5 except for PF    Time  8    Period  Weeks    Status  Partially Met    Target Date  03/28/18      PT LONG TERM GOAL #4   Title  Patient will increase lower extremity functional scale to >50/80 to demonstrate improved functional mobility and increased tolerance with ADLs.     Baseline  8/26: 27/80 10/17: 32/80     Time  8    Period  Weeks    Status  Partially Met    Target Date  03/28/18      PT LONG TERM GOAL #5   Title  Patient will perform 4/5 sit to stand transitions without UE support and without loss of balance to reduce instability during transitions an dimprove patient comfort in public places such as restaurants.     Baseline  10/17: 2/5 without LOB; excessive posterior trunk     Time  8    Period  Weeks    Status  New    Target Date  03/28/18            Plan - 03/05/18 1540    Clinical Impression Statement  Patient challenged with stability on unstable surfaces due to limited ankle reactions requiring occasional use of UE's. Patient challenged with prolonged activation of dorsiflexion musculature resulting in foot flat contact and drag of feet with L>RLE. Patient's termination and initiation of activities is improving with decreased LOB with repetition. Patient will continue to benefit from skilled therapy to improve strength, balance, and stability to decrease fall risk with ambulation    PT Frequency  2x / week    PT Duration  8 weeks  PT Treatment/Interventions   ADLs/Self Care Home Management;Aquatic Therapy;Cryotherapy;Ultrasound;Traction;Moist Heat;Electrical Stimulation;DME Instruction;Gait training;Stair training;Functional mobility training;Neuromuscular re-education;Balance training;Therapeutic exercise;Therapeutic activities;Patient/family education;Orthotic Fit/Training;Manual techniques;Dry needling;Passive range of motion;Compression bandaging;Manual lymph drainage;Energy conservation;Splinting;Taping;Vasopneumatic Device;Visual/perceptual remediation/compensation;Vestibular    Consulted and Agree with Plan of Care  Patient;Family member/caregiver    Family Member Consulted  wife       Patient will benefit from skilled therapeutic intervention in order to improve the following deficits and impairments:  Abnormal gait, Cardiopulmonary status limiting activity, Decreased activity tolerance, Decreased balance, Decreased knowledge of precautions, Decreased endurance, Decreased mobility, Difficulty walking, Decreased strength, Increased edema, Impaired flexibility, Impaired perceived functional ability, Postural dysfunction, Improper body mechanics  Visit Diagnosis: Muscle weakness (generalized)  Other abnormalities of gait and mobility     Problem List There are no active problems to display for this patient.  Janna Arch, PT, DPT   03/05/2018, 3:40 PM  [place] MAIN [facility] SERVICES [address] [place], [place], [address] Phone: [phone]   Fax:  [phone]  Name: DONELLE BABA MRN: [id number] Date of Birth: 1950/09/28

## 2018-03-07 ENCOUNTER — Ambulatory Visit: Payer: Medicare Other

## 2018-03-11 ENCOUNTER — Ambulatory Visit: Payer: Medicare Other

## 2018-03-11 DIAGNOSIS — M6281 Muscle weakness (generalized): Secondary | ICD-10-CM

## 2018-03-11 DIAGNOSIS — R2689 Other abnormalities of gait and mobility: Secondary | ICD-10-CM

## 2018-03-11 NOTE — Therapy (Addendum)
[place] MAIN [facility] SERVICES [address] [place], [place], [address] Phone: [phone]   Fax:  [phone]  Physical Therapy Treatment  Patient Details  Name: Nathan [name] MRN: [id number] Date of Birth: 06-26-1950 No data recorded  Encounter Date: 03/11/2018  PT End of Session - 03/11/18 1436    Visit Number  16    Number of Visits  32    Date for PT Re-Evaluation  03/28/18    Authorization Type  5/10 10/17 next session     PT Start Time  1430    PT Stop Time  1510    PT Time Calculation (min)  40 min    Equipment Utilized During Treatment  Gait belt    Activity Tolerance  Patient tolerated treatment well    Behavior During Therapy  [facility] for tasks assessed/performed       Past Medical History:  Diagnosis Date  . E. coli infection   . History of appendectomy     Past Surgical History:  Procedure Laterality Date  . cardiac catherization    . GASTRIC BYPASS    . upper blepharoplasty      There were no vitals filed for this visit.  Subjective Assessment - 03/11/18 1431    Subjective  Patient states he is doing well. Patient states no falls since last session. Patient states wife procedure went well on Thursday.     Pertinent History  Nathan [name] is a right handed 67 y.o. male retiree with history of diabetes, hypertension , here for evaluation of lower extremity weakness. Patient reports weakness in legs began in December last year. 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 No headaches, no dizziness, no numbness of his legs, no back problems, has never had back surgery. Only recent thing is going on nitrofurantoin twice daily for persistent UTI. Looking for CIDV.     Limitations  Standing;Walking;House hold activities;Other (comment)    Currently in Pain?  No/denies           Nustep Lvl 5 48mnutes RPM>60 for cardiovascular support: SPM 80-90   Sit to stands x10 with  1 UE support.  Verbal cues to pause at top and to sit down slowly. CGA   Standing:     Red light green light stop intervention: CGA in // bars frequent LOB with sudden stop; excessive trunk LOB, SUE when ambulating backwards. Occasional LOB when terminating movement  One foot on dynadisc standing balloon taps in // bars; 10x each LE reaching inside and outside BOS   Backwards walking x2 length of bars. Verbal cues to slow down and widen BOS.    Airex pad: 6 " step step ups SUE support 10x each LE   Airex pad: side step 6" step ups SUE support 10x each LE.   Standing med ball tosses (2000Gr ball) x10. CGA  Standing on airex pad tapping 3 cones. CGA x10 with each LE. 1 UE support. Knocked over 1 cone                       PT Education - 03/11/18 1429    Education Details  exercise technique, stability     Person(s) Educated  Patient    Methods  Explanation;Demonstration;Verbal cues    Comprehension  Verbalized understanding;Returned demonstration       PT Short Term Goals - 01/31/18 1323      PT SHORT TERM GOAL #1  Title  Patient will be independent in home exercise program to improve strength/mobility for better functional independence with ADLs.    Baseline  HEP compliant    Time  2    Period  Weeks    Status  Achieved      PT SHORT TERM GOAL #2   Title  Patient will deny any falls over past 2 weeks to demonstrate improved safety awareness at home and work.     Baseline  falls 1-2x/week ; 10/17: no falls    Time  2    Period  Weeks    Status  Achieved        PT Long Term Goals - 01/31/18 1324      PT LONG TERM GOAL #1   Title  Patient will deny any falls over past 4 weeks to demonstrate improved safety awareness at home and work.     Baseline  falls 1-2x/week  10/17: no falls    Time  8    Period  Weeks    Status  Achieved      PT LONG TERM GOAL #2   Title  Patient will increase dynamic gait index score to >19/24 as to demonstrate reduced fall risk and  improved dynamic gait balance for better safety with community/home ambulation.     Baseline  8/26: 11/24    Time  8    Period  Weeks    Status  New      PT LONG TERM GOAL #3   Title  Patient will increase BLE gross strength to 4+/5 as to improve functional strength for independent gait, increased standing tolerance and increased ADL ability.    Baseline  8/26" unable to perform standing heel raise 10/17: 4/5 except for PF    Time  8    Period  Weeks    Status  Partially Met    Target Date  03/28/18      PT LONG TERM GOAL #4   Title  Patient will increase lower extremity functional scale to >50/80 to demonstrate improved functional mobility and increased tolerance with ADLs.     Baseline  8/26: 27/80 10/17: 32/80     Time  8    Period  Weeks    Status  Partially Met    Target Date  03/28/18      PT LONG TERM GOAL #5   Title  Patient will perform 4/5 sit to stand transitions without UE support and without loss of balance to reduce instability during transitions an dimprove patient comfort in public places such as restaurants.     Baseline  10/17: 2/5 without LOB; excessive posterior trunk     Time  8    Period  Weeks    Status  New    Target Date  03/28/18            Plan - 03/11/18 1521    Clinical Impression Statement  Patient demonstrated improved ability to initiate and terminate movement with no LOB or use of UEs with improved balance reactions. Patient able to successfully ambulate with no UE support in bars with only 1 LOB. Patient challenged with standing balloon taps in modified single leg stance with frequent posterior sway and use of UEs for stabilization due to ankle instability and delayed/limited balance reactions. Patient will continue to benefit from skilled physical therapy to improve strength, balance, and stability to decrease fall risk with ambulation.     PT Frequency  2x / week  PT Duration  8 weeks    PT Treatment/Interventions  ADLs/Self Care Home  Management;Aquatic Therapy;Cryotherapy;Ultrasound;Traction;Moist Heat;Electrical Stimulation;DME Instruction;Gait training;Stair training;Functional mobility training;Neuromuscular re-education;Balance training;Therapeutic exercise;Therapeutic activities;Patient/family education;Orthotic Fit/Training;Manual techniques;Dry needling;Passive range of motion;Compression bandaging;Manual lymph drainage;Energy conservation;Splinting;Taping;Vasopneumatic Device;Visual/perceptual remediation/compensation;Vestibular    Consulted and Agree with Plan of Care  Patient;Family member/caregiver    Family Member Consulted  wife       Patient will benefit from skilled therapeutic intervention in order to improve the following deficits and impairments:  Abnormal gait, Cardiopulmonary status limiting activity, Decreased activity tolerance, Decreased balance, Decreased knowledge of precautions, Decreased endurance, Decreased mobility, Difficulty walking, Decreased strength, Increased edema, Impaired flexibility, Impaired perceived functional ability, Postural dysfunction, Improper body mechanics  Visit Diagnosis: Muscle weakness (generalized)  Other abnormalities of gait and mobility     Problem List There are no active problems to display for this patient.  Erick Blinks, SPT  This entire session was performed under direct supervision and direction of a licensed therapist/therapist assistant . I have personally read, edited and approve of the note as written.  Janna Arch, PT, DPT   03/11/2018, 5:30 PM  [place] MAIN [facility] SERVICES [address] [place], [place], [address] Phone: [phone]   Fax:  [phone]  Name: Nathan [name] MRN: [id number] Date of Birth: 06-Nov-1950

## 2018-03-18 ENCOUNTER — Ambulatory Visit: Payer: Medicare Other | Attending: Internal Medicine

## 2018-03-18 DIAGNOSIS — R2689 Other abnormalities of gait and mobility: Secondary | ICD-10-CM | POA: Insufficient documentation

## 2018-03-18 DIAGNOSIS — M6281 Muscle weakness (generalized): Secondary | ICD-10-CM | POA: Insufficient documentation

## 2018-03-20 ENCOUNTER — Ambulatory Visit: Payer: Medicare Other

## 2018-03-20 DIAGNOSIS — R2689 Other abnormalities of gait and mobility: Secondary | ICD-10-CM | POA: Diagnosis present

## 2018-03-20 DIAGNOSIS — M6281 Muscle weakness (generalized): Secondary | ICD-10-CM

## 2018-03-20 NOTE — Therapy (Signed)
[place] MAIN [facility] SERVICES [address] [place], [place], [address] Phone: [phone]   Fax:  [phone]  Physical Therapy Treatment  Patient Details  Name: Nathan [name] MRN: [id number] Date of Birth: 1950/06/11 No data recorded  Encounter Date: 03/20/2018  PT End of Session - 03/20/18 1437    Visit Number  17    Number of Visits  32    Date for PT Re-Evaluation  03/28/18    Authorization Type  6/10 10/17 next session     PT Start Time  1430    PT Stop Time  1515    PT Time Calculation (min)  45 min    Equipment Utilized During Treatment  Gait belt    Activity Tolerance  Patient tolerated treatment well    Behavior During Therapy  [facility] for tasks assessed/performed       Past Medical History:  Diagnosis Date  . E. coli infection   . History of appendectomy     Past Surgical History:  Procedure Laterality Date  . cardiac catherization    . GASTRIC BYPASS    . upper blepharoplasty      There were no vitals filed for this visit.  Subjective Assessment - 03/20/18 1435    Subjective  Patient reports he had a good thanksgiving, traveled to see family. No falls reported but had a near fall when he hit his nephews trunk mirror and had to stop suddenly. No pain. Went and saw doctor about neuropathy who is pleased per patient report.     Pertinent History  Nathan [name] is a right handed 66 y.o. male retiree with history of diabetes, hypertension , here for evaluation of lower extremity weakness. Patient reports weakness in legs began in December last year. 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 No headaches, no dizziness, no numbness of his legs, no back problems, has never had back surgery. Only recent thing is going on nitrofurantoin twice daily for persistent UTI. Looking for CIDV.     Limitations  Standing;Walking;House hold activities;Other (comment)    Currently in Pain?  No/denies           Nustep Lvl 5 19mnutes RPM>60 for cardiovascular support: SPM 80-90   Seated:  [place] toe pick up for intrinsic musculature strengthening and coordination; 10x each LE, 2 sets   AAROM R first toe flexion 10x ; 2 sets  AAROM R first toe extension 10x ; 2 sets    Sit to stands x10 with  1 UE support. Verbal cues to pause at top and to sit down slowly. CGA  Seated adduction 10x 5 second holds   Seated RTB 2x 15x each LE   Standing:    Airex pad: static balance 60 seconds no UE support. Noted ankle reactions  Airex pad: single knee bends for weight shift, frequently resulted in posterior LOB x 60 seconds  Airex pad: one foot on each color airex pad to perform modified tandem stance 2x 30 seconds each LE occasional use of single UE support required, LOB when no UE support provided                        PT Education - 03/20/18 1436    Education Details  exercise technique, stability     Person(s) Educated  Patient    Methods  Explanation;Demonstration;Verbal cues    Comprehension  Verbalized understanding;Returned demonstration  PT Short Term Goals - 01/31/18 1323      PT SHORT TERM GOAL #1   Title  Patient will be independent in home exercise program to improve strength/mobility for better functional independence with ADLs.    Baseline  HEP compliant    Time  2    Period  Weeks    Status  Achieved      PT SHORT TERM GOAL #2   Title  Patient will deny any falls over past 2 weeks to demonstrate improved safety awareness at home and work.     Baseline  falls 1-2x/week ; 10/17: no falls    Time  2    Period  Weeks    Status  Achieved        PT Long Term Goals - 01/31/18 1324      PT LONG TERM GOAL #1   Title  Patient will deny any falls over past 4 weeks to demonstrate improved safety awareness at home and work.     Baseline  falls 1-2x/week  10/17: no falls    Time  8    Period  Weeks    Status  Achieved      PT LONG TERM GOAL  #2   Title  Patient will increase dynamic gait index score to >19/24 as to demonstrate reduced fall risk and improved dynamic gait balance for better safety with community/home ambulation.     Baseline  8/26: 11/24    Time  8    Period  Weeks    Status  New      PT LONG TERM GOAL #3   Title  Patient will increase BLE gross strength to 4+/5 as to improve functional strength for independent gait, increased standing tolerance and increased ADL ability.    Baseline  8/26" unable to perform standing heel raise 10/17: 4/5 except for PF    Time  8    Period  Weeks    Status  Partially Met    Target Date  03/28/18      PT LONG TERM GOAL #4   Title  Patient will increase lower extremity functional scale to >50/80 to demonstrate improved functional mobility and increased tolerance with ADLs.     Baseline  8/26: 27/80 10/17: 32/80     Time  8    Period  Weeks    Status  Partially Met    Target Date  03/28/18      PT LONG TERM GOAL #5   Title  Patient will perform 4/5 sit to stand transitions without UE support and without loss of balance to reduce instability during transitions an dimprove patient comfort in public places such as restaurants.     Baseline  10/17: 2/5 without LOB; excessive posterior trunk     Time  8    Period  Weeks    Status  New    Target Date  03/28/18            Plan - 03/20/18 1524    Clinical Impression Statement  Patient demonstrated limited big toe/first toe muscle recruitment of R foot potentially limiting his ability to perform balance reactions. Patient challenged with stability without UE support resulting in frequent posterior LOB.  Patient challenged on stability when attention is not focused resulting in posterior LOB. Patient will continue to benefit from skilled physical therapy to improve strength, balance, and stability to decrease fall risk with ambulation.     PT Frequency  2x / week  PT Duration  8 weeks    PT Treatment/Interventions  ADLs/Self  Care Home Management;Aquatic Therapy;Cryotherapy;Ultrasound;Traction;Moist Heat;Electrical Stimulation;DME Instruction;Gait training;Stair training;Functional mobility training;Neuromuscular re-education;Balance training;Therapeutic exercise;Therapeutic activities;Patient/family education;Orthotic Fit/Training;Manual techniques;Dry needling;Passive range of motion;Compression bandaging;Manual lymph drainage;Energy conservation;Splinting;Taping;Vasopneumatic Device;Visual/perceptual remediation/compensation;Vestibular    Consulted and Agree with Plan of Care  Patient;Family member/caregiver    Family Member Consulted  wife       Patient will benefit from skilled therapeutic intervention in order to improve the following deficits and impairments:  Abnormal gait, Cardiopulmonary status limiting activity, Decreased activity tolerance, Decreased balance, Decreased knowledge of precautions, Decreased endurance, Decreased mobility, Difficulty walking, Decreased strength, Increased edema, Impaired flexibility, Impaired perceived functional ability, Postural dysfunction, Improper body mechanics  Visit Diagnosis: Muscle weakness (generalized)  Other abnormalities of gait and mobility     Problem List There are no active problems to display for this patient.  Janna Arch, PT, DPT   03/20/2018, 3:25 PM  [place] MA[place] [facility] SERVICES [address] [place], [place], [address] Phone: [phone]   Fax:  [phone]  Name: Nathan [name] MRN: [id number] Date of Birth: [birth year]

## 2018-03-26 ENCOUNTER — Ambulatory Visit: Payer: Medicare Other

## 2018-03-26 DIAGNOSIS — R2689 Other abnormalities of gait and mobility: Secondary | ICD-10-CM

## 2018-03-26 DIAGNOSIS — M6281 Muscle weakness (generalized): Secondary | ICD-10-CM

## 2018-03-26 NOTE — Therapy (Signed)
[place] MAIN [employer] SERVICES [address] [place], [place], [address] Phone: [phone]   Fax:  [phone]  Physical Therapy Treatment  Patient Details  Name: Nathan [name] MRN: [id number] Date of Birth: December 25, 1950 No data recorded  Encounter Date: 03/26/2018  PT End of Session - 03/26/18 1145    Visit Number  18    Number of Visits  32    Date for PT Re-Evaluation  03/28/18    Authorization Type  7/10 10/17 next session     PT Start Time  1115    PT Stop Time  1200    PT Time Calculation (min)  45 min    Equipment Utilized During Treatment  Gait belt    Activity Tolerance  Patient tolerated treatment well    Behavior During Therapy  [facility] for tasks assessed/performed       Past Medical History:  Diagnosis Date  . E. coli infection   . History of appendectomy     Past Surgical History:  Procedure Laterality Date  . cardiac catherization    . GASTRIC BYPASS    . upper blepharoplasty      There were no vitals filed for this visit.  Subjective Assessment - 03/26/18 1140    Subjective  Patient reports he has had no falls or LOB. Patient has been compliant with HEP and has been practicing his marble pick up and big toe extension.     Pertinent History  Nathan [name] is a right handed 67 y.o. male retiree with history of diabetes, hypertension , here for evaluation of lower extremity weakness. Patient reports weakness in legs began in December last year. 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 No headaches, no dizziness, no numbness of his legs, no back problems, has never had back surgery. Only recent thing is going on nitrofurantoin twice daily for persistent UTI. Looking for CIDV.     Limitations  Standing;Walking;House hold activities;Other (comment)    Currently in Pain?  No/denies        Nustep Lvl 5 87mnutes RPM>60 for cardiovascular support: SPM 80-90    Seated:   AAROM R first toe  flexion 10x ; 2 sets   AAROM R first toe extension 10x ; 2 sets    Sit to stands x10 with  1 UE support. Verbal cues to pause at top and to sit down slowly. CGA   Seated adduction 10x 5 second holds    Seated GTB:  Pf 10x each LE   Df 10x each LE  Inversion 10x each LE  Eversion 10x each LE    Standing:    Airex pad: static balance 60 seconds no UE support. Noted ankle reactions   Airex pad: single knee bends for weight shift, frequently resulted in posterior LOB x 60 seconds   Airex pad: 6" step toe taps 10x each LE SUE support  Airex pad: 6" side toe taps 10x each LE; SUE   Airex pad: one foot on each color airex pad to perform modified tandem stance 2x 30 seconds each LE occasional use of single UE support required, LOB when no UE support provided  half foam roller: heel toe taps BUE support 2 minutes;                        PT Education - 03/26/18 1145    Education Details  exercise technique, stability  Person(s) Educated  Patient    Methods  Explanation;Demonstration;Verbal cues    Comprehension  Verbalized understanding;Returned demonstration       PT Short Term Goals - 01/31/18 1323      PT SHORT TERM GOAL #1   Title  Patient will be independent in home exercise program to improve strength/mobility for better functional independence with ADLs.    Baseline  HEP compliant    Time  2    Period  Weeks    Status  Achieved      PT SHORT TERM GOAL #2   Title  Patient will deny any falls over past 2 weeks to demonstrate improved safety awareness at home and work.     Baseline  falls 1-2x/week ; 10/17: no falls    Time  2    Period  Weeks    Status  Achieved        PT Long Term Goals - 01/31/18 1324      PT LONG TERM GOAL #1   Title  Patient will deny any falls over past 4 weeks to demonstrate improved safety awareness at home and work.     Baseline  falls 1-2x/week  10/17: no falls    Time  8    Period  Weeks    Status  Achieved       PT LONG TERM GOAL #2   Title  Patient will increase dynamic gait index score to >19/24 as to demonstrate reduced fall risk and improved dynamic gait balance for better safety with community/home ambulation.     Baseline  8/26: 11/24    Time  8    Period  Weeks    Status  New      PT LONG TERM GOAL #3   Title  Patient will increase BLE gross strength to 4+/5 as to improve functional strength for independent gait, increased standing tolerance and increased ADL ability.    Baseline  8/26" unable to perform standing heel raise 10/17: 4/5 except for PF    Time  8    Period  Weeks    Status  Partially Met    Target Date  03/28/18      PT LONG TERM GOAL #4   Title  Patient will increase lower extremity functional scale to >50/80 to demonstrate improved functional mobility and increased tolerance with ADLs.     Baseline  8/26: 27/80 10/17: 32/80     Time  8    Period  Weeks    Status  Partially Met    Target Date  03/28/18      PT LONG TERM GOAL #5   Title  Patient will perform 4/5 sit to stand transitions without UE support and without loss of balance to reduce instability during transitions an dimprove patient comfort in public places such as restaurants.     Baseline  10/17: 2/5 without LOB; excessive posterior trunk     Time  8    Period  Weeks    Status  New    Target Date  03/28/18            Plan - 03/26/18 1204    Clinical Impression Statement  Patient demonstrating improved stability on stable and unstable surfaces with decreased UE support required today. Initiation and termination of tasks continue to challenge patient due to need for ankle response for stability. Patient demonstrated improved ankle strength with progression to green theraband. Patient will continue to benefit from skilled physical therapy  to improve strength, balance, and stability to decrease fall risk with ambulation.     PT Frequency  2x / week    PT Duration  8 weeks    PT  Treatment/Interventions  ADLs/Self Care Home Management;Aquatic Therapy;Cryotherapy;Ultrasound;Traction;Moist Heat;Electrical Stimulation;DME Instruction;Gait training;Stair training;Functional mobility training;Neuromuscular re-education;Balance training;Therapeutic exercise;Therapeutic activities;Patient/family education;Orthotic Fit/Training;Manual techniques;Dry needling;Passive range of motion;Compression bandaging;Manual lymph drainage;Energy conservation;Splinting;Taping;Vasopneumatic Device;Visual/perceptual remediation/compensation;Vestibular    Consulted and Agree with Plan of Care  Patient;Family member/caregiver    Family Member Consulted  wife       Patient will benefit from skilled therapeutic intervention in order to improve the following deficits and impairments:  Abnormal gait, Cardiopulmonary status limiting activity, Decreased activity tolerance, Decreased balance, Decreased knowledge of precautions, Decreased endurance, Decreased mobility, Difficulty walking, Decreased strength, Increased edema, Impaired flexibility, Impaired perceived functional ability, Postural dysfunction, Improper body mechanics  Visit Diagnosis: Muscle weakness (generalized)  Other abnormalities of gait and mobility     Problem List There are no active problems to display for this patient.  Janna Arch, PT, DPT   03/26/2018, 12:05 PM  [place] MAIN [facility] SERVICES [address] [place], [place], [address] Phone: [phone]   Fax:  [phone]  Name: Nathan [name] MRN: [id number] Date of Birth: [birth year]

## 2018-04-03 ENCOUNTER — Encounter: Payer: Medicare Other | Admitting: Occupational Therapy

## 2018-04-04 ENCOUNTER — Ambulatory Visit: Payer: Medicare Other

## 2018-04-04 DIAGNOSIS — M6281 Muscle weakness (generalized): Secondary | ICD-10-CM

## 2018-04-04 DIAGNOSIS — R2689 Other abnormalities of gait and mobility: Secondary | ICD-10-CM

## 2018-04-04 NOTE — Therapy (Signed)
[place] MAIN [facility] SERVICES [address] [place], [place], [address] Phone: [phone]   Fax:  [phone]  Physical Therapy Treatment/ RECERT/ Physical Therapy Progress Note   Dates of reporting period  01/31/18   to   04/04/18  Patient Details  Name: Nathan [name] MRN: [id number] Date of Birth: 1951-03-24 No data recorded  Encounter Date: 04/04/2018  PT End of Session - 04/04/18 0953    Visit Number  19    Number of Visits  35    Date for PT Re-Evaluation  05/30/18    Authorization Type  8/10 10/17 next session (next 1/10 PN starting 12/19)    PT Start Time  0845    PT Stop Time  0931    PT Time Calculation (min)  46 min    Equipment Utilized During Treatment  Gait belt    Activity Tolerance  Patient tolerated treatment well    Behavior During Therapy  [facility] for tasks assessed/performed       Past Medical History:  Diagnosis Date  . E. coli infection   . History of appendectomy     Past Surgical History:  Procedure Laterality Date  . cardiac catherization    . GASTRIC BYPASS    . upper blepharoplasty      There were no vitals filed for this visit.  Subjective Assessment - 04/04/18 0952    Subjective  Patient reports no falls in the past week, last fall over a month ago. Reports having a hard time getting R foot up higher, occasionally scuffs it. Thinks that therapy is really helping and has noticed he is more comfortable and steady with his activities.     Pertinent History  [name] is a right handed 67 y.o. male retiree with history of diabetes, hypertension , here for evaluation of lower extremity weakness. Patient reports weakness in legs began in December last year. 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 No headaches, no dizziness, no numbness of his legs, no back problems, has never had back surgery. Only recent thing is going on nitrofurantoin twice daily for persistent UTI.  Looking for CIDV.     Limitations  Standing;Walking;House hold activities;Other (comment)    Currently in Pain?  No/denies         [facility] PT Assessment - 04/04/18 0001      Dynamic Gait Index   Level Surface  Mild Impairment    Change in Gait Speed  Mild Impairment    Gait with Horizontal Head Turns  Mild Impairment    Gait with Vertical Head Turns  Moderate Impairment    Gait and Pivot Turn  Mild Impairment    Step Over Obstacle  Mild Impairment    Step Around Obstacles  Mild Impairment    Steps  Mild Impairment    Total Score  15       RECERT DGI: 17/79 BLE strength: 4/5 strength with 2+/5 PF   4/5 sit to stands without UE support and without LOB: patient performed 4/5 without LOB.   LEFS: 53%    Treatment:  Seated:  Seated GTB:             Pf 10x each LE              Df 10x each LE             Inversion 10x each LE  Eversion 10x each LE    Standing:    Step over and back orange hurdle 10x each LE, one UE support.      Airex pad: 6" step toe taps 10x each LE SUE support   Airex pad: 6" side toe taps 10x each LE; SUE    Airex pad: tapping balloon inside and outside COM with frequent LOB utilization of ankle strategy initially apparent but as fatigued reduced resulting in increased LOB.    Patient's condition has the potential to improve in response to therapy. Maximum improvement is yet to be obtained. The anticipated improvement is attainable and reasonable in a generally predictable time.  Patient reports no falls in the past week, last fall over a month ago. Reports having a hard time getting R foot up higher, occasionally scuffs it.                       PT Education - 04/04/18 0953    Education Details  goals, POC, exercise technique, stability     Person(s) Educated  Patient    Methods  Explanation;Demonstration    Comprehension  Verbalized understanding;Returned demonstration;Need further instruction       PT Short  Term Goals - 04/04/18 2671      PT SHORT TERM GOAL #1   Title  Patient will be independent in home exercise program to improve strength/mobility for better functional independence with ADLs.    Baseline  HEP compliant    Time  2    Period  Weeks    Status  Achieved      PT SHORT TERM GOAL #2   Title  Patient will deny any falls over past 2 weeks to demonstrate improved safety awareness at home and work.     Baseline  falls 1-2x/week ; 10/17: no falls    Time  2    Period  Weeks    Status  Achieved        PT Long Term Goals - 04/04/18 0940      PT LONG TERM GOAL #1   Title  Patient will deny any falls over past 4 weeks to demonstrate improved safety awareness at home and work.     Baseline  falls 1-2x/week  10/17: no falls    Time  8    Period  Weeks    Status  Achieved      PT LONG TERM GOAL #2   Title  Patient will increase dynamic gait index score to >19/24 as to demonstrate reduced fall risk and improved dynamic gait balance for better safety with community/home ambulation.     Baseline  8/26: 11/24 12/19: 15/24    Time  8    Period  Weeks    Status  Partially Met    Target Date  05/30/18      PT LONG TERM GOAL #3   Title  Patient will increase BLE gross strength to 4+/5 as to improve functional strength for independent gait, increased standing tolerance and increased ADL ability.    Baseline  8/26" unable to perform standing heel raise 10/17: 4/5 except for PF 12/19: 4/5 with PF 2+/5     Time  8    Period  Weeks    Status  Partially Met    Target Date  05/02/18      PT LONG TERM GOAL #4   Title  Patient will increase lower extremity functional scale to >50/80 to demonstrate improved functional mobility  and increased tolerance with ADLs.     Baseline  8/26: 27/80 10/17: 32/80 12/19: perform next session    Time  8    Period  Weeks    Status  Partially Met    Target Date  05/30/18      PT LONG TERM GOAL #5   Title  Patient will perform 4/5 sit to stand  transitions without UE support and without loss of balance to reduce instability during transitions an dimprove patient comfort in public places such as restaurants.     Baseline  10/17: 2/5 without LOB; excessive posterior trunk 12/19 performed 4/5 without LOB    Time  8    Period  Weeks    Status  Achieved      Additional Long Term Goals   Additional Long Term Goals  Yes      PT LONG TERM GOAL #6   Title  Patient will increase ABC scale score >80% to demonstrate better functional mobility and better confidence with ADLs.     Baseline  12/19: 53%    Time  8    Period  Weeks    Status  New    Target Date  05/30/18            Plan - 04/04/18 0945    Clinical Impression Statement  Patient demonstrates improved stability with transitions/mobility as seen in ability to perform 4 out of 5 sit to stands without LOB. Patient's DGI is improving with increased stability with horizontal head turns, gait mechanics, and stepping over obstacles indicating improved stability in single limb stance. Initiation and termination of movements continue to challenge patient as he has poor ankle strategy when fatigued. Ankle strategy is improving however upon fatigue reverts to previous compensatory trunk response resulting in LOB. Patient's condition has the potential to improve in response to therapy. Maximum improvement is yet to be obtained. The anticipated improvement is attainable and reasonable in a generally predictable time. Patient will continue to benefit from skilled physical therapy to improve strength, balance, and stability to decrease fall risk with ambulation.    PT Frequency  2x / week    PT Duration  8 weeks    PT Treatment/Interventions  ADLs/Self Care Home Management;Aquatic Therapy;Cryotherapy;Ultrasound;Traction;Moist Heat;Electrical Stimulation;DME Instruction;Gait training;Stair training;Functional mobility training;Neuromuscular re-education;Balance training;Therapeutic  exercise;Therapeutic activities;Patient/family education;Orthotic Fit/Training;Manual techniques;Dry needling;Passive range of motion;Compression bandaging;Manual lymph drainage;Energy conservation;Splinting;Taping;Vasopneumatic Device;Visual/perceptual remediation/compensation;Vestibular    PT Next Visit Plan  ankle strategy, foot stability, initiation/termination of movement    Consulted and Agree with Plan of Care  Patient;Family member/caregiver    Family Member Consulted  wife       Patient will benefit from skilled therapeutic intervention in order to improve the following deficits and impairments:  Abnormal gait, Cardiopulmonary status limiting activity, Decreased activity tolerance, Decreased balance, Decreased knowledge of precautions, Decreased endurance, Decreased mobility, Difficulty walking, Decreased strength, Increased edema, Impaired flexibility, Impaired perceived functional ability, Postural dysfunction, Improper body mechanics  Visit Diagnosis: Muscle weakness (generalized)  Other abnormalities of gait and mobility     Problem List There are no active problems to display for this patient.  Janna Arch, PT, DPT   04/04/2018, 9:55 AM  [place] MAIN [facility] SERVICES [address] [place], [place], [address] Phone: [phone]   Fax:  [phone]  Name: GATES JIVIDEN MRN: [id number] Date of Birth: 06-29-1950

## 2018-04-09 ENCOUNTER — Ambulatory Visit: Payer: Medicare Other

## 2018-04-15 ENCOUNTER — Ambulatory Visit: Payer: Medicare Other

## 2018-04-15 DIAGNOSIS — M6281 Muscle weakness (generalized): Secondary | ICD-10-CM | POA: Diagnosis not present

## 2018-04-15 DIAGNOSIS — R2689 Other abnormalities of gait and mobility: Secondary | ICD-10-CM

## 2018-04-15 NOTE — Therapy (Signed)
[place] MAIN [facility] SERVICES [address] [place], [place], [address] Phone: [phone]   Fax:  [phone]  Physical Therapy Treatment  Patient Details  Name: Nathan [name] MRN: [id number] Date of Birth: 09-Jul-1950 No data recorded  Encounter Date: 04/15/2018  PT End of Session - 04/15/18 1308    Visit Number  20    Number of Visits  35    Date for PT Re-Evaluation  05/30/18    Authorization Type  next 1/10 PN starting 12/19    PT Start Time  1301    PT Stop Time  1345    PT Time Calculation (min)  44 min    Equipment Utilized During Treatment  Gait belt    Activity Tolerance  Patient tolerated treatment well    Behavior During Therapy  [facility] for tasks assessed/performed       Past Medical History:  Diagnosis Date  . E. coli infection   . History of appendectomy     Past Surgical History:  Procedure Laterality Date  . cardiac catherization    . GASTRIC BYPASS    . upper blepharoplasty      There were no vitals filed for this visit.  Subjective Assessment - 04/15/18 1306    Subjective  Patient had a good christmas. Has been compliant with HEP. Patient went to the movie theaters twice and didn't fall, was able to go sit at the first floor so didn't have to go up the steps.     Pertinent History  [name] is a right handed 67 y.o. male retiree with history of diabetes, hypertension , here for evaluation of lower extremity weakness. Patient reports weakness in legs began in December last year. 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 No headaches, no dizziness, no numbness of his legs, no back problems, has never had back surgery. Only recent thing is going on nitrofurantoin twice daily for persistent UTI. Looking for CIDV.     Limitations  Standing;Walking;House hold activities;Other (comment)    Currently in Pain?  No/denies        Nustep Lvl 5 33mnutes RPM>60 for cardiovascular support:  SPM 80-90    Seated:      Sit to stands x10 with  1 UE support. Verbal cues to pause at top and to sit down slowly. CGA   Seated: education on donning/doffing pants due to limited ability to flex R hip.    Seated RTB:             Pf 10x each LE              Df 10x each LE               Standing:    Airex pad: static balance 60 seconds no UE support. Noted ankle reactions  Airex pad: balloon taps reaching inside and outside BOS 3 minutes    Airex pad: single knee bends for weight shift, frequently resulted in posterior LOB x 60 seconds   Airex pad: 6" step toe taps 10x each LE SUE support   Airex pad: 6" side toe taps 10x each LE; SUE    Airex pad: one foot on each color airex pad to perform modified tandem stance 2x 30 seconds each LE occasional use of single UE support required, LOB when no UE support provided   half foam roller: heel toe taps BUE support 2 minutes;  PT Education - 04/15/18 1308    Education Details  exercise technique, stability     Person(s) Educated  Patient    Methods  Explanation;Demonstration;Tactile cues;Verbal cues    Comprehension  Verbalized understanding;Returned demonstration;Verbal cues required;Tactile cues required       PT Short Term Goals - 04/04/18 0942      PT SHORT TERM GOAL #1   Title  Patient will be independent in home exercise program to improve strength/mobility for better functional independence with ADLs.    Baseline  HEP compliant    Time  2    Period  Weeks    Status  Achieved      PT SHORT TERM GOAL #2   Title  Patient will deny any falls over past 2 weeks to demonstrate improved safety awareness at home and work.     Baseline  falls 1-2x/week ; 10/17: no falls    Time  2    Period  Weeks    Status  Achieved        PT Long Term Goals - 04/04/18 0940      PT LONG TERM GOAL #1   Title  Patient will deny any falls over past 4 weeks to demonstrate improved safety awareness  at home and work.     Baseline  falls 1-2x/week  10/17: no falls    Time  8    Period  Weeks    Status  Achieved      PT LONG TERM GOAL #2   Title  Patient will increase dynamic gait index score to >19/24 as to demonstrate reduced fall risk and improved dynamic gait balance for better safety with community/home ambulation.     Baseline  8/26: 11/24 12/19: 15/24    Time  8    Period  Weeks    Status  Partially Met    Target Date  05/30/18      PT LONG TERM GOAL #3   Title  Patient will increase BLE gross strength to 4+/5 as to improve functional strength for independent gait, increased standing tolerance and increased ADL ability.    Baseline  8/26" unable to perform standing heel raise 10/17: 4/5 except for PF 12/19: 4/5 with PF 2+/5     Time  8    Period  Weeks    Status  Partially Met    Target Date  05/02/18      PT LONG TERM GOAL #4   Title  Patient will increase lower extremity functional scale to >50/80 to demonstrate improved functional mobility and increased tolerance with ADLs.     Baseline  8/26: 27/80 10/17: 32/80 12/19: perform next session    Time  8    Period  Weeks    Status  Partially Met    Target Date  05/30/18      PT LONG TERM GOAL #5   Title  Patient will perform 4/5 sit to stand transitions without UE support and without loss of balance to reduce instability during transitions an dimprove patient comfort in public places such as restaurants.     Baseline  10/17: 2/5 without LOB; excessive posterior trunk 12/19 performed 4/5 without LOB    Time  8    Period  Weeks    Status  Achieved      Additional Long Term Goals   Additional Long Term Goals  Yes      PT LONG TERM GOAL #6   Title  Patient will increase  ABC scale score >80% to demonstrate better functional mobility and better confidence with ADLs.     Baseline  12/19: 53%    Time  8    Period  Weeks    Status  New    Target Date  05/30/18            Plan - 04/15/18 1355    Clinical  Impression Statement   Patient demonstrating improved stability on unstable surface with Single UE support. Patient challenged with initiation and termination of movements with poor ankle stability when fatigued. . Patient will continue to benefit from skilled physical therapy to improve strength, balance, and stability to decrease fall risk with ambulation.     PT Frequency  2x / week    PT Duration  8 weeks    PT Treatment/Interventions  ADLs/Self Care Home Management;Aquatic Therapy;Cryotherapy;Ultrasound;Traction;Moist Heat;Electrical Stimulation;DME Instruction;Gait training;Stair training;Functional mobility training;Neuromuscular re-education;Balance training;Therapeutic exercise;Therapeutic activities;Patient/family education;Orthotic Fit/Training;Manual techniques;Dry needling;Passive range of motion;Compression bandaging;Manual lymph drainage;Energy conservation;Splinting;Taping;Vasopneumatic Device;Visual/perceptual remediation/compensation;Vestibular    PT Next Visit Plan  ankle strategy, foot stability, initiation/termination of movement    Consulted and Agree with Plan of Care  Patient;Family member/caregiver    Family Member Consulted  wife       Patient will benefit from skilled therapeutic intervention in order to improve the following deficits and impairments:  Abnormal gait, Cardiopulmonary status limiting activity, Decreased activity tolerance, Decreased balance, Decreased knowledge of precautions, Decreased endurance, Decreased mobility, Difficulty walking, Decreased strength, Increased edema, Impaired flexibility, Impaired perceived functional ability, Postural dysfunction, Improper body mechanics  Visit Diagnosis: Muscle weakness (generalized)  Other abnormalities of gait and mobility     Problem List There are no active problems to display for this patient.  Janna Arch, PT, DPT   04/15/2018, 1:56 PM  [place] MAIN [facility]  SERVICES [address] [place], [place], [address] Phone: [phone]   Fax:  [phone]  Name: SAYGE SALVATO MRN: [id number] Date of Birth: April 22, 1950

## 2018-04-24 ENCOUNTER — Ambulatory Visit: Payer: Medicare Other | Attending: Internal Medicine

## 2018-04-24 DIAGNOSIS — M6281 Muscle weakness (generalized): Secondary | ICD-10-CM | POA: Insufficient documentation

## 2018-04-24 DIAGNOSIS — R2689 Other abnormalities of gait and mobility: Secondary | ICD-10-CM | POA: Diagnosis present

## 2018-04-24 NOTE — Therapy (Signed)
[place] MAIN [facility] SERVICES [address] [place], [place], [address] Phone: [phone]   Fax:  [phone]  Physical Therapy Treatment  Patient Details  Name: Nathan [name] MRN: [id number] Date of Birth: 1950/09/25 No data recorded  Encounter Date: 04/24/2018  PT End of Session - 04/24/18 0853    Visit Number  21    Number of Visits  35    Date for PT Re-Evaluation  05/30/18    Authorization Type  next 2/10 PN starting 12/19    PT Start Time  0847    PT Stop Time  0932    PT Time Calculation (min)  45 min    Equipment Utilized During Treatment  Gait belt    Activity Tolerance  Patient tolerated treatment well    Behavior During Therapy  [facility] for tasks assessed/performed       Past Medical History:  Diagnosis Date  . E. coli infection   . History of appendectomy     Past Surgical History:  Procedure Laterality Date  . cardiac catherization    . GASTRIC BYPASS    . upper blepharoplasty      There were no vitals filed for this visit.  Subjective Assessment - 04/24/18 0852    Subjective  Patient had a good New Years, has been doing his toe and leg exercises. Is having an easier time putting his pants on. No falls since last visits.     Pertinent History  Nathan [name] is a right handed 68 y.o. male retiree with history of diabetes, hypertension , here for evaluation of lower extremity weakness. Patient reports weakness in legs began in December last year. 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 No headaches, no dizziness, no numbness of his legs, no back problems, has never had back surgery. Only recent thing is going on nitrofurantoin twice daily for persistent UTI. Looking for CIDV.     Limitations  Standing;Walking;House hold activities;Other (comment)    Currently in Pain?  No/denies        Nustep Lvl 5 88mnutes RPM>60 for cardiovascular support: SPM 80-90    Seated:    Sit to stands x10  with  1 UE support. Verbal cues to pause at top and to sit down slowly. CGA   Seated dynadisc alphabet ankle spell: BLE  Seated RTB:             Pf 10x each LE              Df 10x each LE   Standing:    Stoplight game: start and stop on green light vs red light for initiation and termination of movement control for improved stability and decreased episodes of LOB in // bars  Airex pad: static balance 60 seconds no UE support. Noted ankle reactions   Airex pad: balloon taps reaching inside and outside BOS 3 minutes       Airex pad: 6" step toe taps 10x each LE SUE support   Airex pad: 6" side toe taps 10x each LE; SUE    Airex pad: one foot on each color airex pad to perform modified tandem stance 2x 30 seconds each LE occasional use of single UE support required, LOB when no UE support provided   half foam roller: heel toe taps BUE support 2 minutes  PT Education - 04/24/18 0853    Education Details  exercise technique, stability     Person(s) Educated  Patient    Methods  Explanation;Demonstration;Tactile cues;Verbal cues    Comprehension  Verbalized understanding;Returned demonstration;Verbal cues required;Tactile cues required;Need further instruction       PT Short Term Goals - 04/04/18 5885      PT SHORT TERM GOAL #1   Title  Patient will be independent in home exercise program to improve strength/mobility for better functional independence with ADLs.    Baseline  HEP compliant    Time  2    Period  Weeks    Status  Achieved      PT SHORT TERM GOAL #2   Title  Patient will deny any falls over past 2 weeks to demonstrate improved safety awareness at home and work.     Baseline  falls 1-2x/week ; 10/17: no falls    Time  2    Period  Weeks    Status  Achieved        PT Long Term Goals - 04/04/18 0940      PT LONG TERM GOAL #1   Title  Patient will deny any falls over past 4 weeks to demonstrate improved safety  awareness at home and work.     Baseline  falls 1-2x/week  10/17: no falls    Time  8    Period  Weeks    Status  Achieved      PT LONG TERM GOAL #2   Title  Patient will increase dynamic gait index score to >19/24 as to demonstrate reduced fall risk and improved dynamic gait balance for better safety with community/home ambulation.     Baseline  8/26: 11/24 12/19: 15/24    Time  8    Period  Weeks    Status  Partially Met    Target Date  05/30/18      PT LONG TERM GOAL #3   Title  Patient will increase BLE gross strength to 4+/5 as to improve functional strength for independent gait, increased standing tolerance and increased ADL ability.    Baseline  8/26" unable to perform standing heel raise 10/17: 4/5 except for PF 12/19: 4/5 with PF 2+/5     Time  8    Period  Weeks    Status  Partially Met    Target Date  05/02/18      PT LONG TERM GOAL #4   Title  Patient will increase lower extremity functional scale to >50/80 to demonstrate improved functional mobility and increased tolerance with ADLs.     Baseline  8/26: 27/80 10/17: 32/80 12/19: perform next session    Time  8    Period  Weeks    Status  Partially Met    Target Date  05/30/18      PT LONG TERM GOAL #5   Title  Patient will perform 4/5 sit to stand transitions without UE support and without loss of balance to reduce instability during transitions an dimprove patient comfort in public places such as restaurants.     Baseline  10/17: 2/5 without LOB; excessive posterior trunk 12/19 performed 4/5 without LOB    Time  8    Period  Weeks    Status  Achieved      Additional Long Term Goals   Additional Long Term Goals  Yes      PT LONG TERM GOAL #6   Title  Patient  will increase ABC scale score >80% to demonstrate better functional mobility and better confidence with ADLs.     Baseline  12/19: 53%    Time  8    Period  Weeks    Status  New    Target Date  05/30/18            Plan - 04/24/18 5830     Clinical Impression Statement  Patient demonstrated improved termination and initiation of movement. Patient challenged with prolonged stabilization on unstable surface with noted fatigue of ankles reducing stability of ankle reactions. Patient will continue to benefit from skilled physical therapy to improve strength, balance, and stability to decrease fall risk with ambulation.    PT Frequency  2x / week    PT Duration  8 weeks    PT Treatment/Interventions  ADLs/Self Care Home Management;Aquatic Therapy;Cryotherapy;Ultrasound;Traction;Moist Heat;Electrical Stimulation;DME Instruction;Gait training;Stair training;Functional mobility training;Neuromuscular re-education;Balance training;Therapeutic exercise;Therapeutic activities;Patient/family education;Orthotic Fit/Training;Manual techniques;Dry needling;Passive range of motion;Compression bandaging;Manual lymph drainage;Energy conservation;Splinting;Taping;Vasopneumatic Device;Visual/perceptual remediation/compensation;Vestibular    PT Next Visit Plan  ankle strategy, foot stability, initiation/termination of movement    Consulted and Agree with Plan of Care  Patient;Family member/caregiver    Family Member Consulted  wife       Patient will benefit from skilled therapeutic intervention in order to improve the following deficits and impairments:  Abnormal gait, Cardiopulmonary status limiting activity, Decreased activity tolerance, Decreased balance, Decreased knowledge of precautions, Decreased endurance, Decreased mobility, Difficulty walking, Decreased strength, Increased edema, Impaired flexibility, Impaired perceived functional ability, Postural dysfunction, Improper body mechanics  Visit Diagnosis: Muscle weakness (generalized)  Other abnormalities of gait and mobility     Problem List There are no active problems to display for this patient.  Janna Arch, PT, DPT   04/24/2018, 9:35 AM  [place] MAIN [facility] SERVICES [address] [place], [place], [address] Phone: [phone]   Fax:  [phone]  Name: Nathan [name] MRN: [id number] Date of Birth: September 19, 1950

## 2018-04-26 ENCOUNTER — Ambulatory Visit: Payer: Medicare Other

## 2018-05-01 ENCOUNTER — Ambulatory Visit: Payer: Medicare Other

## 2018-05-03 ENCOUNTER — Ambulatory Visit: Payer: Medicare Other

## 2018-05-07 ENCOUNTER — Ambulatory Visit: Payer: Medicare Other

## 2018-05-07 DIAGNOSIS — R2689 Other abnormalities of gait and mobility: Secondary | ICD-10-CM

## 2018-05-07 DIAGNOSIS — M6281 Muscle weakness (generalized): Secondary | ICD-10-CM | POA: Diagnosis not present

## 2018-05-07 NOTE — Therapy (Signed)
[place] MAIN [facility] SERVICES [address] [place], [place], [address] Phone: [phone]   Fax:  [phone]  Physical Therapy Treatment  Patient Details  Name: Nathan [name] MRN: [id number] Date of Birth: [birth year] No data recorded  Encounter Date: 05/07/2018    Past Medical History:  Diagnosis Date  . E. coli infection   . History of appendectomy     Past Surgical History:  Procedure Laterality Date  . cardiac catherization    . GASTRIC BYPASS    . upper blepharoplasty      There were no vitals filed for this visit.    Nustep Lvl 5 8mnutes RPM>60 for cardiovascular support: SPM 80-90    Seated:    Sit to stands x10 with  1 UE support. Verbal cues to pause at top and to sit down slowly. CGA   Seated dynadisc alphabet ankle spell: BLE   Seated RTB: PT holding band and cueing for bodymechanics.              Pf 10x each LE              Df 10x each LE   EV 10x each LE  IV 10x each LE Standing:    Stoplight game: start and stop on green light vs red light for initiation and termination of movement control for improved stability and decreased episodes of LOB in // bars; CGA and verbal cueing for tasks.    Airex pad: static balance 60 seconds no UE support. Noted ankle reactions; CGA cues for upright position   Airex pad: balloon taps reaching inside and outside BOS 3 minutes      Airex pad: 6" step toe taps 10x each LE SUE support; CGA cues for foot position and weight shift   Airex pad: 6" side toe taps 10x each LE; SUE, CGA with cues for foot position and weight shift   Airex pad: one foot on each color airex pad to perform modified tandem stance 2x 30 seconds each LE occasional use of single UE support required, LOB when no UE support provided   Airex pad: eyes closed one finger tip on // bars. CGA with excessive shaking of BLE's with fatigue 2x30 seconds, very challenging to patient   half foam roller: heel toe taps  BUE support 2 minutes                             PT Short Term Goals - 04/04/18 09292     PT SHORT TERM GOAL #1   Title  Patient will be independent in home exercise program to improve strength/mobility for better functional independence with ADLs.    Baseline  HEP compliant    Time  2    Period  Weeks    Status  Achieved      PT SHORT TERM GOAL #2   Title  Patient will deny any falls over past 2 weeks to demonstrate improved safety awareness at home and work.     Baseline  falls 1-2x/week ; 10/17: no falls    Time  2    Period  Weeks    Status  Achieved        PT Long Term Goals - 04/04/18 0940      PT LONG TERM GOAL #1   Title  Patient will deny any falls over past 4 weeks to demonstrate improved safety awareness at home  and work.     Baseline  falls 1-2x/week  10/17: no falls    Time  8    Period  Weeks    Status  Achieved      PT LONG TERM GOAL #2   Title  Patient will increase dynamic gait index score to >19/24 as to demonstrate reduced fall risk and improved dynamic gait balance for better safety with community/home ambulation.     Baseline  8/26: 11/24 12/19: 15/24    Time  8    Period  Weeks    Status  Partially Met    Target Date  05/30/18      PT LONG TERM GOAL #3   Title  Patient will increase BLE gross strength to 4+/5 as to improve functional strength for independent gait, increased standing tolerance and increased ADL ability.    Baseline  8/26" unable to perform standing heel raise 10/17: 4/5 except for PF 12/19: 4/5 with PF 2+/5     Time  8    Period  Weeks    Status  Partially Met    Target Date  05/02/18      PT LONG TERM GOAL #4   Title  Patient will increase lower extremity functional scale to >50/80 to demonstrate improved functional mobility and increased tolerance with ADLs.     Baseline  8/26: 27/80 10/17: 32/80 12/19: perform next session    Time  8    Period  Weeks    Status  Partially Met    Target Date   05/30/18      PT LONG TERM GOAL #5   Title  Patient will perform 4/5 sit to stand transitions without UE support and without loss of balance to reduce instability during transitions an dimprove patient comfort in public places such as restaurants.     Baseline  10/17: 2/5 without LOB; excessive posterior trunk 12/19 performed 4/5 without LOB    Time  8    Period  Weeks    Status  Achieved      Additional Long Term Goals   Additional Long Term Goals  Yes      PT LONG TERM GOAL #6   Title  Patient will increase ABC scale score >80% to demonstrate better functional mobility and better confidence with ADLs.     Baseline  12/19: 53%    Time  8    Period  Weeks    Status  New    Target Date  05/30/18              Patient will benefit from skilled therapeutic intervention in order to improve the following deficits and impairments:     Visit Diagnosis: No diagnosis found.     Problem List There are no active problems to display for this patient.  Janna Arch, PT, DPT   05/07/2018, 8:45 AM  [place] MAIN [facility] SERVICES [address] [place], [place], [address] Phone: [phone]   Fax:  [phone]  Name: OLANDA DOWNIE MRN: [id number] Date of Birth: [birth year]

## 2018-05-10 ENCOUNTER — Ambulatory Visit: Payer: Medicare Other

## 2018-05-13 ENCOUNTER — Ambulatory Visit: Payer: Medicare Other

## 2018-05-13 DIAGNOSIS — M6281 Muscle weakness (generalized): Secondary | ICD-10-CM

## 2018-05-13 DIAGNOSIS — R2689 Other abnormalities of gait and mobility: Secondary | ICD-10-CM

## 2018-05-13 NOTE — Therapy (Signed)
[place] MAIN [facility] SERVICES [address] [place], [place], [address] Phone: [phone]   Fax:  [phone]  Physical Therapy Treatment  Patient Details  Name: Nathan [name] MRN: [id number] Date of Birth: 1951-03-23 No data recorded  Encounter Date: 05/13/2018  PT End of Session - 05/13/18 0923    Visit Number  23    Number of Visits  35    Date for PT Re-Evaluation  05/30/18    Authorization Type  next 4/10 PN starting 12/19    PT Start Time  0917    PT Stop Time  1004    PT Time Calculation (min)  47 min    Equipment Utilized During Treatment  Gait belt    Activity Tolerance  Patient tolerated treatment well    Behavior During Therapy  [facility] for tasks assessed/performed       Past Medical History:  Diagnosis Date  . E. coli infection   . History of appendectomy     Past Surgical History:  Procedure Laterality Date  . cardiac catherization    . GASTRIC BYPASS    . upper blepharoplasty      There were no vitals filed for this visit.  Subjective Assessment - 05/13/18 0921    Subjective  Patient leaves Wednesday for [facility]. Has been compliant with HEP. Reports no LOB or falls since last session.     Pertinent History  Nathan [name] is a right handed 68 y.o. male retiree with history of diabetes, hypertension , here for evaluation of lower extremity weakness. Patient reports weakness in legs began in December last year. 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 No headaches, no dizziness, no numbness of his legs, no back problems, has never had back surgery. Only recent thing is going on nitrofurantoin twice daily for persistent UTI. Looking for CIDV.     Limitations  Standing;Walking;House hold activities;Other (comment)    Currently in Pain?  No/denies          Nustep Lvl 5 26mnutes RPM>60 for cardiovascular support: SPM 80-90    Seated:    Sit to stands x10 with  1 UE support. Verbal  cues to pause at top and to sit down slowly. CGA   Seated dynadisc alphabet ankle spell: BLE   Seated RTB: PT holding band and cueing for bodymechanics.              Pf 10x each LE              Df 10x each LE              EV 10x each LE             IV 10x each LE  Standing:    Stoplight game: start and stop on green light vs red light for initiation and termination of movement control for improved stability and decreased episodes of LOB in // bars; CGA and verbal cueing for tasks.    Airex pad: static balance 60 seconds no UE support. Noted ankle reactions; CGA cues for upright position   Airex pad: balloon taps reaching inside and outside BOS 3 minutes    Airex pad: 4" step toe taps 10x each LE SUE support; CGA cues for foot position and weight shift   Airex pad: 4" side toe taps 10x each LE; SUE, CGA with cues for foot position and weight shift   Airex pad: one foot on  each color airex pad to perform modified tandem stance 2x 30 seconds each LE occasional use of single UE support required, LOB when no UE support provided   Airex pad: eyes closed one finger tip on // bars. CGA with excessive shaking of BLE's with fatigue 2x30 seconds, very challenging to patient    half foam roller: heel toe taps BUE support 2 minutes                       PT Education - 05/13/18 0922    Education Details  exercise technique, stability     Person(s) Educated  Patient    Methods  Explanation;Demonstration;Other (comment);Tactile cues;Verbal cues    Comprehension  Verbalized understanding;Returned demonstration;Tactile cues required;Need further instruction;Verbal cues required       PT Short Term Goals - 04/04/18 0942      PT SHORT TERM GOAL #1   Title  Patient will be independent in home exercise program to improve strength/mobility for better functional independence with ADLs.    Baseline  HEP compliant    Time  2    Period  Weeks    Status  Achieved      PT SHORT  TERM GOAL #2   Title  Patient will deny any falls over past 2 weeks to demonstrate improved safety awareness at home and work.     Baseline  falls 1-2x/week ; 10/17: no falls    Time  2    Period  Weeks    Status  Achieved        PT Long Term Goals - 04/04/18 0940      PT LONG TERM GOAL #1   Title  Patient will deny any falls over past 4 weeks to demonstrate improved safety awareness at home and work.     Baseline  falls 1-2x/week  10/17: no falls    Time  8    Period  Weeks    Status  Achieved      PT LONG TERM GOAL #2   Title  Patient will increase dynamic gait index score to >19/24 as to demonstrate reduced fall risk and improved dynamic gait balance for better safety with community/home ambulation.     Baseline  8/26: 11/24 12/19: 15/24    Time  8    Period  Weeks    Status  Partially Met    Target Date  05/30/18      PT LONG TERM GOAL #3   Title  Patient will increase BLE gross strength to 4+/5 as to improve functional strength for independent gait, increased standing tolerance and increased ADL ability.    Baseline  8/26" unable to perform standing heel raise 10/17: 4/5 except for PF 12/19: 4/5 with PF 2+/5     Time  8    Period  Weeks    Status  Partially Met    Target Date  05/02/18      PT LONG TERM GOAL #4   Title  Patient will increase lower extremity functional scale to >50/80 to demonstrate improved functional mobility and increased tolerance with ADLs.     Baseline  8/26: 27/80 10/17: 32/80 12/19: perform next session    Time  8    Period  Weeks    Status  Partially Met    Target Date  05/30/18      PT LONG TERM GOAL #5   Title  Patient will perform 4/5 sit to stand transitions without UE  support and without loss of balance to reduce instability during transitions an dimprove patient comfort in public places such as restaurants.     Baseline  10/17: 2/5 without LOB; excessive posterior trunk 12/19 performed 4/5 without LOB    Time  8    Period  Weeks     Status  Achieved      Additional Long Term Goals   Additional Long Term Goals  Yes      PT LONG TERM GOAL #6   Title  Patient will increase ABC scale score >80% to demonstrate better functional mobility and better confidence with ADLs.     Baseline  12/19: 53%    Time  8    Period  Weeks    Status  New    Target Date  05/30/18            Plan - 05/13/18 0944    Clinical Impression Statement  Patient continues to be challenged with ankle righting reactions resulting in instability on unstable surfaces. Patient demonstrates improved core contraction for postural correction to reduce trunk instability. Patient will continue to benefit from skilled physical therapy to improve strength, balance, and stability to decrease fall risk with ambulation.    PT Frequency  2x / week    PT Duration  8 weeks    PT Treatment/Interventions  ADLs/Self Care Home Management;Aquatic Therapy;Cryotherapy;Ultrasound;Traction;Moist Heat;Electrical Stimulation;DME Instruction;Gait training;Stair training;Functional mobility training;Neuromuscular re-education;Balance training;Therapeutic exercise;Therapeutic activities;Patient/family education;Orthotic Fit/Training;Manual techniques;Dry needling;Passive range of motion;Compression bandaging;Manual lymph drainage;Energy conservation;Splinting;Taping;Vasopneumatic Device;Visual/perceptual remediation/compensation;Vestibular    PT Next Visit Plan  ankle strategy, foot stability, initiation/termination of movement    Consulted and Agree with Plan of Care  Patient;Family member/caregiver    Family Member Consulted  wife       Patient will benefit from skilled therapeutic intervention in order to improve the following deficits and impairments:  Abnormal gait, Cardiopulmonary status limiting activity, Decreased activity tolerance, Decreased balance, Decreased knowledge of precautions, Decreased endurance, Decreased mobility, Difficulty walking, Decreased strength,  Increased edema, Impaired flexibility, Impaired perceived functional ability, Postural dysfunction, Improper body mechanics  Visit Diagnosis: Muscle weakness (generalized)  Other abnormalities of gait and mobility     Problem List There are no active problems to display for this patient.  Janna Arch, PT, DPT   05/13/2018, 10:06 AM  [place] MAIN [facility] SERVICES [address] [place], [place], [address] Phone: [phone]   Fax:  [phone]  Name: Nathan [name] MRN: [id number] Date of Birth: [birth year]

## 2018-05-16 ENCOUNTER — Ambulatory Visit: Payer: Medicare Other

## 2018-05-29 ENCOUNTER — Ambulatory Visit: Payer: Medicare Other | Attending: Internal Medicine

## 2018-05-29 DIAGNOSIS — M6281 Muscle weakness (generalized): Secondary | ICD-10-CM | POA: Diagnosis present

## 2018-05-29 DIAGNOSIS — R2689 Other abnormalities of gait and mobility: Secondary | ICD-10-CM | POA: Diagnosis present

## 2018-05-29 NOTE — Therapy (Signed)
[place] MAIN [facility] SERVICES [address] [place], [place], [address] Phone: [phone]   Fax:  [phone]  Physical Therapy Treatment/ RECERT  Patient Details  Name: Nathan [name] MRN: [id number] Date of Birth: Dec 10, 1950 No data recorded  Encounter Date: 05/29/2018  PT End of Session - 05/29/18 1021    Visit Number  24    Number of Visits  40    Date for PT Re-Evaluation  07/24/18    Authorization Type  next 5/10 PN starting 12/19    PT Start Time  1015    PT Stop Time  1102    PT Time Calculation (min)  47 min    Equipment Utilized During Treatment  Gait belt    Activity Tolerance  Patient tolerated treatment well    Behavior During Therapy  [facility] for tasks assessed/performed       Past Medical History:  Diagnosis Date  . E. coli infection   . History of appendectomy     Past Surgical History:  Procedure Laterality Date  . cardiac catherization    . GASTRIC BYPASS    . upper blepharoplasty      There were no vitals filed for this visit.  Subjective Assessment - 05/29/18 1019    Subjective  Patient returned back from his week in [place]. Reports his walking has been doing well. Was very busy while in [place]. Has no falls while on trip.     Pertinent History  Nathan [name] is a right handed 68 y.o. male retiree with history of diabetes, hypertension , here for evaluation of lower extremity weakness. Patient reports weakness in legs began in December last year. 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 No headaches, no dizziness, no numbness of his legs, no back problems, has never had back surgery. Only recent thing is going on nitrofurantoin twice daily for persistent UTI. Looking for CIDV.     Limitations  Standing;Walking;House hold activities;Other (comment)    Currently in Pain?  No/denies         [facility] PT Assessment - 05/29/18 0001      Dynamic Gait Index   Level Surface  Mild  Impairment    Change in Gait Speed  Mild Impairment    Gait with Horizontal Head Turns  Mild Impairment    Gait with Vertical Head Turns  Mild Impairment    Gait and Pivot Turn  Mild Impairment    Step Over Obstacle  Mild Impairment    Step Around Obstacles  Mild Impairment    Steps  Moderate Impairment    Total Score  15      Goals:   DGI: 15/24,  Eccentric stairs when fatigued performed one step at a time, when not fatigued able to perform better.  Strength: hips 4+/5, knees 4/5, ankles: 3/5 LEFS: 28/80 ABC: 60.7%  Treatment  Walking and stop suddently/abruptly: 19 seconds prior to LOB  Static stand in // bars: 13 seconds until LOB.   Treat:  airex pad: static stand 3x 3-60 second holds with occasional posterior instability.  Ambulate with no AD and CGA with frequent stops and static stands occasional LOB requiring assistance to retain stability with abrupt termination of movement.  Df/pf rocking on half foam roller BUE support   Seated df : 10x                 PT Education - 05/29/18 1020    Education Details  goals, POC, stability    Person(s) Educated  Patient    Methods  Explanation;Demonstration;Tactile cues;Verbal cues    Comprehension  Verbalized understanding;Returned demonstration;Verbal cues required;Tactile cues required;Need further instruction       PT Short Term Goals - 05/29/18 1040      PT SHORT TERM GOAL #1   Title  Patient will be independent in home exercise program to improve strength/mobility for better functional independence with ADLs.    Baseline  HEP compliant    Time  2    Period  Weeks    Status  Achieved      PT SHORT TERM GOAL #2   Title  Patient will deny any falls over past 2 weeks to demonstrate improved safety awareness at home and work.     Baseline  falls 1-2x/week ; 10/17: no falls    Time  2    Period  Weeks    Status  Achieved        PT Long Term Goals - 05/29/18 1040      PT LONG TERM GOAL #1    Title  Patient will deny any falls over past 4 weeks to demonstrate improved safety awareness at home and work.     Baseline  falls 1-2x/week  10/17: no falls    Time  8    Period  Weeks    Status  Achieved      PT LONG TERM GOAL #2   Title  Patient will increase dynamic gait index score to >19/24 as to demonstrate reduced fall risk and improved dynamic gait balance for better safety with community/home ambulation.     Baseline  8/26: 11/24 12/19: 15/24 2/12: 15/24     Time  8    Period  Weeks    Status  Partially Met    Target Date  07/24/18      PT LONG TERM GOAL #3   Title  Patient will increase BLE gross strength to 4+/5 as to improve functional strength for independent gait, increased standing tolerance and increased ADL ability.    Baseline  8/26" unable to perform standing heel raise 10/17: 4/5 except for PF 12/19: 4/5 with PF 2+/5  2/12: hips 4+/5 knees 4/5 ankles 3/5     Time  8    Period  Weeks    Status  Partially Met    Target Date  07/24/18      PT LONG TERM GOAL #4   Title  Patient will increase lower extremity functional scale to >50/80 to demonstrate improved functional mobility and increased tolerance with ADLs.     Baseline  8/26: 27/80 10/17: 32/80 12/19: perform next session 2/12: 28/80    Time  8    Period  Weeks    Status  Partially Met    Target Date  07/24/18      PT LONG TERM GOAL #5   Title  Patient will perform 4/5 sit to stand transitions without UE support and without loss of balance to reduce instability during transitions an dimprove patient comfort in public places such as restaurants.     Baseline  10/17: 2/5 without LOB; excessive posterior trunk 12/19 performed 4/5 without LOB    Time  8    Period  Weeks    Status  Achieved      Additional Long Term Goals   Additional Long Term Goals  Yes      PT LONG TERM GOAL #6   Title  Patient will increase ABC scale score >80% to demonstrate better functional mobility and better confidence with ADLs.      Baseline  12/19: 53% 2/12: 60.7%    Time  8    Period  Weeks    Status  Partially Met    Target Date  07/24/18      PT LONG TERM GOAL #7   Title  Patient will demonstrate improved static stability by being able to maintain COM for >60 seconds without LOB upon abrupt termination of movement and/or with static stand.     Baseline  2/12: 13 seconds    Time  8    Period  Weeks    Status  New    Target Date  07/24/18            Plan - 05/29/18 1146    Clinical Impression Statement  Standing still continues to be challenging. Only able to stand still for 3 seconds, challenged stopping from movement. Is now able to put on pants and walk better without LOB. Loses balance to left and back more often then right or forward. Patient demonstrates improved LE strength with hips and ankles indicating improved mobility. Patient has only had 4 sessions since last goals assessment due to the holidays and then being on vacation. Patient's condition has the potential to improve in response to therapy. Maximum improvement is yet to be obtained. The anticipated improvement is attainable and reasonable in a generally predictable time. Patient will continue to benefit from skilled physical therapy to improve strength, balance, and stability to decrease fall risk with ambulation    PT Frequency  2x / week    PT Duration  8 weeks    PT Treatment/Interventions  ADLs/Self Care Home Management;Aquatic Therapy;Cryotherapy;Ultrasound;Traction;Moist Heat;Electrical Stimulation;DME Instruction;Gait training;Stair training;Functional mobility training;Neuromuscular re-education;Balance training;Therapeutic exercise;Therapeutic activities;Patient/family education;Orthotic Fit/Training;Manual techniques;Dry needling;Passive range of motion;Compression bandaging;Manual lymph drainage;Energy conservation;Splinting;Taping;Vasopneumatic Device;Visual/perceptual remediation/compensation;Vestibular    PT Next Visit Plan  ankle  strategy, foot stability, initiation/termination of movement    Consulted and Agree with Plan of Care  Patient;Family member/caregiver    Family Member Consulted  wife       Patient will benefit from skilled therapeutic intervention in order to improve the following deficits and impairments:  Abnormal gait, Cardiopulmonary status limiting activity, Decreased activity tolerance, Decreased balance, Decreased knowledge of precautions, Decreased endurance, Decreased mobility, Difficulty walking, Decreased strength, Increased edema, Impaired flexibility, Impaired perceived functional ability, Postural dysfunction, Improper body mechanics  Visit Diagnosis: Muscle weakness (generalized)  Other abnormalities of gait and mobility     Problem List There are no active problems to display for this patient.   Janna Arch, PT, DPT   05/29/2018, 11:51 AM  [place] MAIN [facility] SERVICES [address] [place], [place], [address] Phone: [phone]   Fax:  [phone]  Name: Nathan [name] MRN: [id number] Date of Birth: [birth year]

## 2018-06-04 ENCOUNTER — Ambulatory Visit: Payer: Medicare Other

## 2018-06-04 DIAGNOSIS — R2689 Other abnormalities of gait and mobility: Secondary | ICD-10-CM

## 2018-06-04 DIAGNOSIS — M6281 Muscle weakness (generalized): Secondary | ICD-10-CM

## 2018-06-04 NOTE — Therapy (Signed)
[place] MAIN [facility] SERVICES [address] [place], [place], [address] Phone: [phone]   Fax:  [phone]  Physical Therapy Treatment  Patient Details  Name: Nathan [name] MRN: [id number] Date of Birth: [birth year] No data recorded  Encounter Date: 06/04/2018  PT End of Session - 06/04/18 0851    Visit Number  25    Number of Visits  40    Date for PT Re-Evaluation  07/24/18    Authorization Type  next 6/10 PN starting 12/19    PT Start Time  0846    PT Stop Time  0930    PT Time Calculation (min)  44 min    Equipment Utilized During Treatment  Gait belt    Activity Tolerance  Patient tolerated treatment well    Behavior During Therapy  [facility] for tasks assessed/performed       Past Medical History:  Diagnosis Date  . E. coli infection   . History of appendectomy     Past Surgical History:  Procedure Laterality Date  . cardiac catherization    . GASTRIC BYPASS    . upper blepharoplasty      There were no vitals filed for this visit.  Subjective Assessment - 06/04/18 0850    Subjective  Patient reports compliance with his HEP. Is a little unsteady today. No falls since last session but a few episodes of instability. Had to wear a halter monitor for 24 hours due to having an abnormal EKG.     Pertinent History  Mr. Sheerin is a right handed 68 y.o. male retiree with history of diabetes, hypertension , here for evaluation of lower extremity weakness. Patient reports weakness in legs began in December last year. 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 No headaches, no dizziness, no numbness of his legs, no back problems, has never had back surgery. Only recent thing is going on nitrofurantoin twice daily for persistent UTI. Looking for CIDV.     Limitations  Standing;Walking;House hold activities;Other (comment)    Currently in Pain?  No/denies       Nustep Lvl 5 63mnutes RPM>60 for  cardiovascular support: SPM 80-90    Seated:      Seated dynadisc alphabet ankle spell: BLE   Seated RTB: PT holding band and cueing for bodymechanics.              Pf 10x each LE              Df 10x each LE              EV 10x each LE             IV 10x each LE   Seated marches 10x each LE   Standing:    Airex pad: static balance 60 seconds no UE support. Noted ankle reactions; CGA cues for upright position   Airex pad; tic tac toe: 2 games of ~ 2 minutes, reaching inside and outside BOS  Airex pad: 4" step toe taps 10x each LE SUE support; CGA cues for foot position and weight shift   Airex pad: 4" side toe taps 10x each LE; SUE, CGA with cues for foot position and weight shift   Airex pad: one foot on each color airex pad to perform modified tandem stance 2x 30 seconds each LE occasional use of single UE support required, LOB when no UE support provided   Airex pad: eyes closed  one finger tip on // bars. CGA with excessive shaking of BLE's with fatigue 2x30 seconds, very challenging to patient    half foam roller: heel toe taps BUE support 2 minutes                       PT Education - 06/04/18 0851    Education Details  exercise technique, stability     Person(s) Educated  Patient    Methods  Explanation;Demonstration;Tactile cues;Verbal cues    Comprehension  Verbalized understanding;Returned demonstration;Verbal cues required;Tactile cues required;Need further instruction       PT Short Term Goals - 05/29/18 1040      PT SHORT TERM GOAL #1   Title  Patient will be independent in home exercise program to improve strength/mobility for better functional independence with ADLs.    Baseline  HEP compliant    Time  2    Period  Weeks    Status  Achieved      PT SHORT TERM GOAL #2   Title  Patient will deny any falls over past 2 weeks to demonstrate improved safety awareness at home and work.     Baseline  falls 1-2x/week ; 10/17: no falls     Time  2    Period  Weeks    Status  Achieved        PT Long Term Goals - 05/29/18 1040      PT LONG TERM GOAL #1   Title  Patient will deny any falls over past 4 weeks to demonstrate improved safety awareness at home and work.     Baseline  falls 1-2x/week  10/17: no falls    Time  8    Period  Weeks    Status  Achieved      PT LONG TERM GOAL #2   Title  Patient will increase dynamic gait index score to >19/24 as to demonstrate reduced fall risk and improved dynamic gait balance for better safety with community/home ambulation.     Baseline  8/26: 11/24 12/19: 15/24 2/12: 15/24     Time  8    Period  Weeks    Status  Partially Met    Target Date  07/24/18      PT LONG TERM GOAL #3   Title  Patient will increase BLE gross strength to 4+/5 as to improve functional strength for independent gait, increased standing tolerance and increased ADL ability.    Baseline  8/26" unable to perform standing heel raise 10/17: 4/5 except for PF 12/19: 4/5 with PF 2+/5  2/12: hips 4+/5 knees 4/5 ankles 3/5     Time  8    Period  Weeks    Status  Partially Met    Target Date  07/24/18      PT LONG TERM GOAL #4   Title  Patient will increase lower extremity functional scale to >50/80 to demonstrate improved functional mobility and increased tolerance with ADLs.     Baseline  8/26: 27/80 10/17: 32/80 12/19: perform next session 2/12: 28/80    Time  8    Period  Weeks    Status  Partially Met    Target Date  07/24/18      PT LONG TERM GOAL #5   Title  Patient will perform 4/5 sit to stand transitions without UE support and without loss of balance to reduce instability during transitions an dimprove patient comfort in public places such as restaurants.  Baseline  10/17: 2/5 without LOB; excessive posterior trunk 12/19 performed 4/5 without LOB    Time  8    Period  Weeks    Status  Achieved      Additional Long Term Goals   Additional Long Term Goals  Yes      PT LONG TERM GOAL #6    Title  Patient will increase ABC scale score >80% to demonstrate better functional mobility and better confidence with ADLs.     Baseline  12/19: 53% 2/12: 60.7%    Time  8    Period  Weeks    Status  Partially Met    Target Date  07/24/18      PT LONG TERM GOAL #7   Title  Patient will demonstrate improved static stability by being able to maintain COM for >60 seconds without LOB upon abrupt termination of movement and/or with static stand.     Baseline  2/12: 13 seconds    Time  8    Period  Weeks    Status  New    Target Date  07/24/18            Plan - 06/04/18 0900    Clinical Impression Statement  Patient demonstrates good motivation throughout session. Static stability continues to challenge patient the most at this time with decreasing ability to retain COM with prolonged static stand. Dynamic mobility is improving. Patient demonstrates improved core contraction for postural correction to reduce trunk instability. Patient will continue to benefit from skilled physical therapy to improve strength, balance, and stability to decrease fall risk with ambulation    PT Frequency  2x / week    PT Duration  8 weeks    PT Treatment/Interventions  ADLs/Self Care Home Management;Aquatic Therapy;Cryotherapy;Ultrasound;Traction;Moist Heat;Electrical Stimulation;DME Instruction;Gait training;Stair training;Functional mobility training;Neuromuscular re-education;Balance training;Therapeutic exercise;Therapeutic activities;Patient/family education;Orthotic Fit/Training;Manual techniques;Dry needling;Passive range of motion;Compression bandaging;Manual lymph drainage;Energy conservation;Splinting;Taping;Vasopneumatic Device;Visual/perceptual remediation/compensation;Vestibular    PT Next Visit Plan  ankle strategy, foot stability, initiation/termination of movement    Consulted and Agree with Plan of Care  Patient;Family member/caregiver    Family Member Consulted  wife       Patient will  benefit from skilled therapeutic intervention in order to improve the following deficits and impairments:  Abnormal gait, Cardiopulmonary status limiting activity, Decreased activity tolerance, Decreased balance, Decreased knowledge of precautions, Decreased endurance, Decreased mobility, Difficulty walking, Decreased strength, Increased edema, Impaired flexibility, Impaired perceived functional ability, Postural dysfunction, Improper body mechanics  Visit Diagnosis: Muscle weakness (generalized)  Other abnormalities of gait and mobility     Problem List There are no active problems to display for this patient.  Janna Arch, PT, DPT   06/04/2018, 9:34 AM  [place] MAIN [facility] SERVICES [address] [place], [place], [address] Phone: [phone]   Fax:  [phone]  Name: Nathan [name] MRN: [id number] Date of Birth: 20-May-1950

## 2018-06-10 ENCOUNTER — Ambulatory Visit: Payer: Medicare Other

## 2018-06-10 DIAGNOSIS — M6281 Muscle weakness (generalized): Secondary | ICD-10-CM

## 2018-06-10 DIAGNOSIS — R2689 Other abnormalities of gait and mobility: Secondary | ICD-10-CM

## 2018-06-10 NOTE — Addendum Note (Signed)
Addended by: Claudie Fisherman on: 06/10/2018 09:38 AM   Modules accepted: Orders

## 2018-06-10 NOTE — Therapy (Addendum)
[place] MAIN [facility] SERVICES [address] [place], [place], [address] Phone: [phone]   Fax:  [phone]  Physical Therapy Treatment  Patient Details  Name: Nathan [name] MRN: [id number] Date of Birth: [birth year] No data recorded  Encounter Date: 06/10/2018  PT End of Session - 06/10/18 1142    Visit Number  26    Number of Visits  40    Date for PT Re-Evaluation  07/24/18    Authorization Type  next 7/10 PN starting 12/19    PT Start Time  0948    PT Stop Time  1027    PT Time Calculation (min)  39 min    Equipment Utilized During Treatment  Gait belt    Activity Tolerance  Patient tolerated treatment well    Behavior During Therapy  [facility] for tasks assessed/performed       Past Medical History:  Diagnosis Date  . E. coli infection   . History of appendectomy     Past Surgical History:  Procedure Laterality Date  . cardiac catherization    . GASTRIC BYPASS    . upper blepharoplasty      There were no vitals filed for this visit.  Subjective Assessment - 06/10/18 0950    Subjective  The patient reports that he is doing well today and that he had a good weekend. He denies falls/stumbles since last visit. He notes that he has been more careful around his home recently to prevent loss of balance/falls.    Pertinent History  Mr. Broadwell is a right handed 68 y.o. male retiree with history of diabetes, hypertension , here for evaluation of lower extremity weakness. Patient reports weakness in legs began in December last year. 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 No headaches, no dizziness, no numbness of his legs, no back problems, has never had back surgery. Only recent thing is going on nitrofurantoin twice daily for persistent UTI. Looking for CIDV.     Limitations  Standing;Walking;House hold activities;Other (comment)    Currently in Pain?  No/denies        Nustep Lvl 5 41mnutes  RPM>60 for cardiovascular support: SPM 80-90   Neuro Re-Ed: Ambulate with no AD and stop and perform static stand upon SPT command with CGA 4x50 feet; patient demonstrates occasional loss of balance requiring minimal SPT assistance or use of unilateral UE on wall to correct; patient benefits from verbal cues to activate core to prevent throwing shoulders/upper torso back when he loses balance for safety  Stoplight game for changing gait speeds: fast vs normal vs slow for movement control for improved stability and decreased episodes of LOB 4x50 feet; patient demonstrates rare loss of balance requiring minimal PT assistance or use of unilateral UE on wall to correct  Airex pad: 4" step alternating toe taps x45 seconds; one finger support for balance in parallel bars; CGA   Airex pad: 4" side toe taps x45 seconds each LE; one finger support in for balance parallel bars; CGA   Airex pad: eyes closed without UE support in  // bars. CGA with moderate shaking of BLE's with fatigue 2x30 seconds; patient demonstrates one loss of balance anteriorly in each bout requiring minimal SPT assistance to correct   TherEx: Sit to stands with unilateral UE assistance 1x4 from standard height; patient indicates it is very challenging for him and that it requires a lot of energy and strength to perform  Seated hamstring  stretch x60 seconds each LE                      PT Education - 06/10/18 1141    Education Details  exercise technique, stability, mobility    Person(s) Educated  Patient    Methods  Explanation;Demonstration;Tactile cues;Verbal cues    Comprehension  Verbalized understanding;Returned demonstration;Verbal cues required;Tactile cues required;Need further instruction       PT Short Term Goals - 05/29/18 1040      PT SHORT TERM GOAL #1   Title  Patient will be independent in home exercise program to improve strength/mobility for better functional independence with ADLs.     Baseline  HEP compliant    Time  2    Period  Weeks    Status  Achieved      PT SHORT TERM GOAL #2   Title  Patient will deny any falls over past 2 weeks to demonstrate improved safety awareness at home and work.     Baseline  falls 1-2x/week ; 10/17: no falls    Time  2    Period  Weeks    Status  Achieved        PT Long Term Goals - 05/29/18 1040      PT LONG TERM GOAL #1   Title  Patient will deny any falls over past 4 weeks to demonstrate improved safety awareness at home and work.     Baseline  falls 1-2x/week  10/17: no falls    Time  8    Period  Weeks    Status  Achieved      PT LONG TERM GOAL #2   Title  Patient will increase dynamic gait index score to >19/24 as to demonstrate reduced fall risk and improved dynamic gait balance for better safety with community/home ambulation.     Baseline  8/26: 11/24 12/19: 15/24 2/12: 15/24     Time  8    Period  Weeks    Status  Partially Met    Target Date  07/24/18      PT LONG TERM GOAL #3   Title  Patient will increase BLE gross strength to 4+/5 as to improve functional strength for independent gait, increased standing tolerance and increased ADL ability.    Baseline  8/26" unable to perform standing heel raise 10/17: 4/5 except for PF 12/19: 4/5 with PF 2+/5  2/12: hips 4+/5 knees 4/5 ankles 3/5     Time  8    Period  Weeks    Status  Partially Met    Target Date  07/24/18      PT LONG TERM GOAL #4   Title  Patient will increase lower extremity functional scale to >50/80 to demonstrate improved functional mobility and increased tolerance with ADLs.     Baseline  8/26: 27/80 10/17: 32/80 12/19: perform next session 2/12: 28/80    Time  8    Period  Weeks    Status  Partially Met    Target Date  07/24/18      PT LONG TERM GOAL #5   Title  Patient will perform 4/5 sit to stand transitions without UE support and without loss of balance to reduce instability during transitions an dimprove patient comfort in public places  such as restaurants.     Baseline  10/17: 2/5 without LOB; excessive posterior trunk 12/19 performed 4/5 without LOB    Time  8    Period  Weeks    Status  Achieved      Additional Long Term Goals   Additional Long Term Goals  Yes      PT LONG TERM GOAL #6   Title  Patient will increase ABC scale score >80% to demonstrate better functional mobility and better confidence with ADLs.     Baseline  12/19: 53% 2/12: 60.7%    Time  8    Period  Weeks    Status  Partially Met    Target Date  07/24/18      PT LONG TERM GOAL #7   Title  Patient will demonstrate improved static stability by being able to maintain COM for >60 seconds without LOB upon abrupt termination of movement and/or with static stand.     Baseline  2/12: 13 seconds    Time  8    Period  Weeks    Status  New    Target Date  07/24/18            Plan - 06/10/18 1156    Clinical Impression Statement  The patient continues to improve and progress with static and dynamic mobility. The patient demonstrates improved ability to change gait speeds and stop on command without significant loss of balance as compared to previous sessions, but still demonstrates need for assistance/guarding during performance of these tasks without an assistive device. The patient also continues to demonstrate fatigue with more challenging exercises and requires frequent rest breaks. The patient will continue to benefit from skilled PT in order to work towards goals, to improve BLE strength, balance, and stability in order to decrease risk of falls and to maximize safety and independence with functional mobility.    PT Frequency  2x / week    PT Duration  8 weeks    PT Treatment/Interventions  ADLs/Self Care Home Management;Aquatic Therapy;Cryotherapy;Ultrasound;Traction;Moist Heat;Electrical Stimulation;DME Instruction;Gait training;Stair training;Functional mobility training;Neuromuscular re-education;Balance training;Therapeutic  exercise;Therapeutic activities;Patient/family education;Orthotic Fit/Training;Manual techniques;Dry needling;Passive range of motion;Compression bandaging;Manual lymph drainage;Energy conservation;Splinting;Taping;Vasopneumatic Device;Visual/perceptual remediation/compensation;Vestibular    PT Next Visit Plan  ankle strategy, foot stability, initiation/termination of movement, BLE strength    Consulted and Agree with Plan of Care  Patient;Family member/caregiver    Family Member Consulted  --       Patient will benefit from skilled therapeutic intervention in order to improve the following deficits and impairments:  Abnormal gait, Cardiopulmonary status limiting activity, Decreased activity tolerance, Decreased balance, Decreased knowledge of precautions, Decreased endurance, Decreased mobility, Difficulty walking, Decreased strength, Increased edema, Impaired flexibility, Impaired perceived functional ability, Postural dysfunction, Improper body mechanics  Visit Diagnosis: Muscle weakness (generalized)  Other abnormalities of gait and mobility     Problem List There are no active problems to display for this patient.  Orlean Patten, SPT  This entire session was performed under direct supervision and direction of a licensed therapist/therapist assistant . I have personally read, edited and approve of the note as written.  Janna Arch, PT, DPT   06/10/2018, 12:52 PM  [place] MAIN [facility] SERVICES [address] [place], [place], [address] Phone: [phone]   Fax:  [phone]  Name: JESUSMANUEL ERBES MRN: [id number] Date of Birth: Dec 30, 1950

## 2018-06-17 ENCOUNTER — Ambulatory Visit: Payer: Medicare Other | Attending: Internal Medicine

## 2018-06-17 DIAGNOSIS — M6281 Muscle weakness (generalized): Secondary | ICD-10-CM | POA: Diagnosis present

## 2018-06-17 DIAGNOSIS — R2689 Other abnormalities of gait and mobility: Secondary | ICD-10-CM

## 2018-06-17 NOTE — Therapy (Addendum)
[place] MAIN [facility] SERVICES [address] [place], [place], [address] Phone: [phone]   Fax:  [phone]  Physical Therapy Treatment  Patient Details  Name: Nathan [name] MRN: [id number] Date of Birth: [birth year] No data recorded  Encounter Date: 06/17/2018  PT End of Session - 06/17/18 1054    Visit Number  27    Number of Visits  40    Date for PT Re-Evaluation  07/24/18    Authorization Type  next 8/10 PN starting 12/19    PT Start Time  0843    PT Stop Time  0925    PT Time Calculation (min)  42 min    Equipment Utilized During Treatment  Gait belt    Activity Tolerance  Patient tolerated treatment well    Behavior During Therapy  [facility] for tasks assessed/performed       Past Medical History:  Diagnosis Date  . E. coli infection   . History of appendectomy     Past Surgical History:  Procedure Laterality Date  . cardiac catherization    . GASTRIC BYPASS    . upper blepharoplasty      There were no vitals filed for this visit.  Subjective Assessment - 06/17/18 1053    Subjective  Patient reports that he is doing well today and that he had a good weekend. He denies pain today and falls/stumbles since last visit. He notes that he feels that his balance has been a little better over the past few days as compared to in the days surrounding the previous visit.    Pertinent History  Mr. Cheatwood is a right handed 68 y.o. male retiree with history of diabetes, hypertension , here for evaluation of lower extremity weakness. Patient reports weakness in legs began in December last year. 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 No headaches, no dizziness, no numbness of his legs, no back problems, has never had back surgery. Only recent thing is going on nitrofurantoin twice daily for persistent UTI. Looking for CIDV.     Limitations  Standing;Walking;House hold activities;Other (comment)    Currently  in Pain?  No/denies      Nustep Lvl 4 5 minutes RPM>60 for cardiovascular support: SPM 80-90   Neuro Re-Ed: Hallway: Ambulate without AD weaving around 4 cones down and back x3; performs with CGA with rare use of UE on wall to regain stability  Hallway: Patient instructed to ambulate to first cone 5 feet away then stop at cone for 10 seconds, then walk around second cone an additional 5 feet away, stop again at first cone, and repeat; patient performs 5 trials; performs with CGA with intermittent use of UE on wall when stopping/stopped to regain stability; patient indicates walking portion easy, walking around cone medium difficulty, and stopping and standing in place difficult; patient benefits from verbal cues to tighten core during static standing/stopped portion to prevent trunk sway  Airex pad: one foot on each color airex pad to perform modified tandem stance 2x 30 seconds each LE; occasional LOB when no UE support provided   Airex pad: eyes closed without UE support in parallel bars. CGA  2x30 seconds; patient experiences two posterior losses of balance during performance with minimal assistance from SPT to correct; patient benefits from verbal cues to tighten core  Firm surface: normal stance horizontal trunk rotations with ball (arms extended) x10 each side; patient reports initially easy to maintain balance, but increased  difficulty with increasing repetitions due to fatigue; performs with CGA; demonstrates intermittent LOB posteriorly requiring minimal assistance x1 from SPT to correct  TherEx: Seated hamstring stretch x60 seconds each LE  Seated dynadisc alphabet ankle spell: BLE   Seated RTB: PT holding band and cueing for bodymechanics.              Pf 10x each LE              Df 10x each LE             EV 10x each LE             IV 10x each LE                         PT Education - 06/17/18 1054    Education Details  exercise technique, stability,  dynamic mobility    Person(s) Educated  Patient    Methods  Explanation;Demonstration;Tactile cues;Verbal cues    Comprehension  Verbalized understanding;Returned demonstration;Tactile cues required;Need further instruction;Verbal cues required       PT Short Term Goals - 05/29/18 1040      PT SHORT TERM GOAL #1   Title  Patient will be independent in home exercise program to improve strength/mobility for better functional independence with ADLs.    Baseline  HEP compliant    Time  2    Period  Weeks    Status  Achieved      PT SHORT TERM GOAL #2   Title  Patient will deny any falls over past 2 weeks to demonstrate improved safety awareness at home and work.     Baseline  falls 1-2x/week ; 10/17: no falls    Time  2    Period  Weeks    Status  Achieved        PT Long Term Goals - 05/29/18 1040      PT LONG TERM GOAL #1   Title  Patient will deny any falls over past 4 weeks to demonstrate improved safety awareness at home and work.     Baseline  falls 1-2x/week  10/17: no falls    Time  8    Period  Weeks    Status  Achieved      PT LONG TERM GOAL #2   Title  Patient will increase dynamic gait index score to >19/24 as to demonstrate reduced fall risk and improved dynamic gait balance for better safety with community/home ambulation.     Baseline  8/26: 11/24 12/19: 15/24 2/12: 15/24     Time  8    Period  Weeks    Status  Partially Met    Target Date  07/24/18      PT LONG TERM GOAL #3   Title  Patient will increase BLE gross strength to 4+/5 as to improve functional strength for independent gait, increased standing tolerance and increased ADL ability.    Baseline  8/26" unable to perform standing heel raise 10/17: 4/5 except for PF 12/19: 4/5 with PF 2+/5  2/12: hips 4+/5 knees 4/5 ankles 3/5     Time  8    Period  Weeks    Status  Partially Met    Target Date  07/24/18      PT LONG TERM GOAL #4   Title  Patient will increase lower extremity functional scale to  >50/80 to demonstrate improved functional mobility and increased tolerance with ADLs.  Baseline  8/26: 27/80 10/17: 32/80 12/19: perform next session 2/12: 28/80    Time  8    Period  Weeks    Status  Partially Met    Target Date  07/24/18      PT LONG TERM GOAL #5   Title  Patient will perform 4/5 sit to stand transitions without UE support and without loss of balance to reduce instability during transitions an dimprove patient comfort in public places such as restaurants.     Baseline  10/17: 2/5 without LOB; excessive posterior trunk 12/19 performed 4/5 without LOB    Time  8    Period  Weeks    Status  Achieved      Additional Long Term Goals   Additional Long Term Goals  Yes      PT LONG TERM GOAL #6   Title  Patient will increase ABC scale score >80% to demonstrate better functional mobility and better confidence with ADLs.     Baseline  12/19: 53% 2/12: 60.7%    Time  8    Period  Weeks    Status  Partially Met    Target Date  07/24/18      PT LONG TERM GOAL #7   Title  Patient will demonstrate improved static stability by being able to maintain COM for >60 seconds without LOB upon abrupt termination of movement and/or with static stand.     Baseline  2/12: 13 seconds    Time  8    Period  Weeks    Status  New    Target Date  07/24/18            Plan - 06/17/18 1202    Clinical Impression Statement  The patient continues to progress to more challenging static and dynamic balance and mobility activities. The patient demonstrated improved stability with eyes closed static standing on the Airex pad as compared to previous sessions, and also demonstrated good dynamic balance while weaving around cones today. However, the patient demonstrated difficulty with performing static stance balance after stopping ambulation at a cone, in addition to observable fatigue with the performance of 10 normal stance ball twists/rotations for strength and stability. The patient will  continue to benefit from skilled PT in order to work towards goals, to improve BLE strength, balance, and stability in order to decrease risk of falls and to maximize safety and independence with functional mobility.     PT Frequency  2x / week    PT Duration  8 weeks    PT Treatment/Interventions  ADLs/Self Care Home Management;Aquatic Therapy;Cryotherapy;Ultrasound;Traction;Moist Heat;Electrical Stimulation;DME Instruction;Gait training;Stair training;Functional mobility training;Neuromuscular re-education;Balance training;Therapeutic exercise;Therapeutic activities;Patient/family education;Orthotic Fit/Training;Manual techniques;Dry needling;Passive range of motion;Compression bandaging;Manual lymph drainage;Energy conservation;Splinting;Taping;Vasopneumatic Device;Visual/perceptual remediation/compensation;Vestibular    PT Next Visit Plan  ankle strategy, foot stability, initiation/termination of movement, BLE strength, dynamic mobility/balance    Consulted and Agree with Plan of Care  Patient;Family member/caregiver       Patient will benefit from skilled therapeutic intervention in order to improve the following deficits and impairments:  Abnormal gait, Cardiopulmonary status limiting activity, Decreased activity tolerance, Decreased balance, Decreased knowledge of precautions, Decreased endurance, Decreased mobility, Difficulty walking, Decreased strength, Increased edema, Impaired flexibility, Impaired perceived functional ability, Postural dysfunction, Improper body mechanics  Visit Diagnosis: Muscle weakness (generalized)  Other abnormalities of gait and mobility     Problem List There are no active problems to display for this patient.  Orlean Patten, SPT  This entire session was performed  under direct supervision and direction of a licensed Chiropractor . I have personally read, edited and approve of the note as written.  Janna Arch, PT, DPT    06/17/2018,  12:52 PM  [place] MAIN [facility] SERVICES [address] [place], [place], [address] Phone: [phone]   Fax:  [phone]  Name: Nathan [name] MRN: [id number] Date of Birth: [birth year]

## 2018-06-27 ENCOUNTER — Ambulatory Visit: Payer: Medicare Other

## 2018-06-28 ENCOUNTER — Ambulatory Visit: Payer: Medicare Other

## 2018-07-02 ENCOUNTER — Ambulatory Visit: Payer: Medicare Other

## 2018-07-03 ENCOUNTER — Other Ambulatory Visit: Payer: Self-pay

## 2018-07-03 ENCOUNTER — Ambulatory Visit: Payer: Medicare Other

## 2018-07-03 DIAGNOSIS — M6281 Muscle weakness (generalized): Secondary | ICD-10-CM | POA: Diagnosis not present

## 2018-07-03 DIAGNOSIS — R2689 Other abnormalities of gait and mobility: Secondary | ICD-10-CM

## 2018-07-03 NOTE — Therapy (Signed)
[place] MAIN [facility] SERVICES [address] [place], [place], [address] Phone: [phone]   Fax:  [phone]  Physical Therapy Treatment  Patient Details  Name: Nathan [name] MRN: [id number] Date of Birth: April 22, 1950 No data recorded  Encounter Date: 07/03/2018  PT End of Session - 07/03/18 0806    Visit Number  28    Number of Visits  40    Date for PT Re-Evaluation  07/24/18    Authorization Type  next 9/10 PN starting 12/19    PT Start Time  0800    PT Stop Time  0844    PT Time Calculation (min)  44 min    Equipment Utilized During Treatment  Gait belt    Activity Tolerance  Patient tolerated treatment well    Behavior During Therapy  [facility] for tasks assessed/performed       Past Medical History:  Diagnosis Date  . E. coli infection   . History of appendectomy     Past Surgical History:  Procedure Laterality Date  . cardiac catherization    . GASTRIC BYPASS    . upper blepharoplasty      There were no vitals filed for this visit.  Subjective Assessment - 07/03/18 0805    Subjective  Patient went to [facility] for [facility] championship. Returned home early due to games being cancelled. Reports no falls since last session but a few tumbles.     Pertinent History  Nathan [name] is a right handed 68 y.o. male retiree with history of diabetes, hypertension , here for evaluation of lower extremity weakness. Patient reports weakness in legs began in December last year. 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 No headaches, no dizziness, no numbness of his legs, no back problems, has never had back surgery. Only recent thing is going on nitrofurantoin twice daily for persistent UTI. Looking for CIDV.     Limitations  Standing;Walking;House hold activities;Other (comment)    Currently in Pain?  No/denies           Patient has been gone the past two weeks.     Nustep Lvl 4 5 minutes RPM>60 for  cardiovascular support: SPM 80-90    Neuro Re-Ed:   Airex pad: one foot on each color airex pad to perform modified tandem stance 2x 30 seconds each LE; occasional LOB when no UE support provided   Airex pad: eyes open without UE support in parallel bars. CGA  2x30 seconds; patient experiences two posterior losses of balance during performance; patient benefits from verbal cues to tighten core   Firm surface: normal stance horizontal trunk rotations with ball (arms extended) x10 each side; patient reports initially easy to maintain balance, but increased difficulty with increasing repetitions due to fatigue; performs with CGA; demonstrates intermittent LOB posteriorly   TherEx: Seated hamstring stretch x60 seconds each LE   Seated dynadisc circles 10x clockwise, 10x counterclockwise   Seated RTB: PT holding band and cueing for body mechanics.              Pf 10x each LE              Df 10x each LE             EV 10x each LE             IV 10x each LE  PT Education - 07/03/18 0806    Education Details  exercise technique, stability, dynamic mobility     Person(s) Educated  Patient    Methods  Explanation;Demonstration;Tactile cues;Verbal cues    Comprehension  Verbalized understanding;Returned demonstration;Verbal cues required;Tactile cues required       PT Short Term Goals - 05/29/18 1040      PT SHORT TERM GOAL #1   Title  Patient will be independent in home exercise program to improve strength/mobility for better functional independence with ADLs.    Baseline  HEP compliant    Time  2    Period  Weeks    Status  Achieved      PT SHORT TERM GOAL #2   Title  Patient will deny any falls over past 2 weeks to demonstrate improved safety awareness at home and work.     Baseline  falls 1-2x/week ; 10/17: no falls    Time  2    Period  Weeks    Status  Achieved        PT Long Term Goals - 05/29/18 1040      PT LONG TERM GOAL #1   Title   Patient will deny any falls over past 4 weeks to demonstrate improved safety awareness at home and work.     Baseline  falls 1-2x/week  10/17: no falls    Time  8    Period  Weeks    Status  Achieved      PT LONG TERM GOAL #2   Title  Patient will increase dynamic gait index score to >19/24 as to demonstrate reduced fall risk and improved dynamic gait balance for better safety with community/home ambulation.     Baseline  8/26: 11/24 12/19: 15/24 2/12: 15/24     Time  8    Period  Weeks    Status  Partially Met    Target Date  07/24/18      PT LONG TERM GOAL #3   Title  Patient will increase BLE gross strength to 4+/5 as to improve functional strength for independent gait, increased standing tolerance and increased ADL ability.    Baseline  8/26" unable to perform standing heel raise 10/17: 4/5 except for PF 12/19: 4/5 with PF 2+/5  2/12: hips 4+/5 knees 4/5 ankles 3/5     Time  8    Period  Weeks    Status  Partially Met    Target Date  07/24/18      PT LONG TERM GOAL #4   Title  Patient will increase lower extremity functional scale to >50/80 to demonstrate improved functional mobility and increased tolerance with ADLs.     Baseline  8/26: 27/80 10/17: 32/80 12/19: perform next session 2/12: 28/80    Time  8    Period  Weeks    Status  Partially Met    Target Date  07/24/18      PT LONG TERM GOAL #5   Title  Patient will perform 4/5 sit to stand transitions without UE support and without loss of balance to reduce instability during transitions an dimprove patient comfort in public places such as restaurants.     Baseline  10/17: 2/5 without LOB; excessive posterior trunk 12/19 performed 4/5 without LOB    Time  8    Period  Weeks    Status  Achieved      Additional Long Term Goals   Additional Long Term Goals  Yes  PT LONG TERM GOAL #6   Title  Patient will increase ABC scale score >80% to demonstrate better functional mobility and better confidence with ADLs.      Baseline  12/19: 53% 2/12: 60.7%    Time  8    Period  Weeks    Status  Partially Met    Target Date  07/24/18      PT LONG TERM GOAL #7   Title  Patient will demonstrate improved static stability by being able to maintain COM for >60 seconds without LOB upon abrupt termination of movement and/or with static stand.     Baseline  2/12: 13 seconds    Time  8    Period  Weeks    Status  New    Target Date  07/24/18            Plan - 07/03/18 0847    Clinical Impression Statement  Patient fatigued quickly throughout session due to being absent for two weeks from trip. Patient's static stability is more limited than dynamic due to poor ankle reactions. Patient is challenged with dual tasking. The patient will continue to benefit from skilled PT in order to work towards goals, to improve BLE strength, balance, and stability in order to decrease risk of falls and to maximize safety and independence with functional mobility.    PT Frequency  2x / week    PT Duration  8 weeks    PT Treatment/Interventions  ADLs/Self Care Home Management;Aquatic Therapy;Cryotherapy;Ultrasound;Traction;Moist Heat;Electrical Stimulation;DME Instruction;Gait training;Stair training;Functional mobility training;Neuromuscular re-education;Balance training;Therapeutic exercise;Therapeutic activities;Patient/family education;Orthotic Fit/Training;Manual techniques;Dry needling;Passive range of motion;Compression bandaging;Manual lymph drainage;Energy conservation;Splinting;Taping;Vasopneumatic Device;Visual/perceptual remediation/compensation;Vestibular    PT Next Visit Plan  ankle strategy, foot stability, initiation/termination of movement, BLE strength, dynamic mobility/balance    Consulted and Agree with Plan of Care  Patient;Family member/caregiver       Patient will benefit from skilled therapeutic intervention in order to improve the following deficits and impairments:  Abnormal gait, Cardiopulmonary status  limiting activity, Decreased activity tolerance, Decreased balance, Decreased knowledge of precautions, Decreased endurance, Decreased mobility, Difficulty walking, Decreased strength, Increased edema, Impaired flexibility, Impaired perceived functional ability, Postural dysfunction, Improper body mechanics  Visit Diagnosis: Muscle weakness (generalized)  Other abnormalities of gait and mobility     Problem List There are no active problems to display for this patient.  Janna Arch, PT, DPT   07/03/2018, 8:48 AM  [place] MAIN [facility] SERVICES [address] [place], [place], [address] Phone: [phone]   Fax:  [phone]  Name: GARI HARTSELL MRN: [id number] Date of Birth: [birth year]

## 2018-07-05 ENCOUNTER — Ambulatory Visit: Payer: Medicare Other

## 2018-07-08 NOTE — Therapy (Unsigned)
[place] [facility] MAIN [facility] SERVICES [address] [place], [place], [address] Phone: [phone]   Fax:  [phone]  Patient Details  Name: Nathan [name] MRN: [id number] Date of Birth: 1950-08-08 Referring Provider:  No ref. provider found  Encounter Date: 07/08/2018  Patient called by Physical Therapist to ensure patient is doing well during this time that the outpatient physical therapy clinic is closed as well as discuss any questions or concerns the patient may have. Reviewed exercises verbally. Patient agreeable to participation with HEP. Patient verbalized understanding and will reach back out with further questions as needed.   Precious Bard, PT, DPT   07/08/2018, 11:51 AM   [facility] MAIN [facility] SERVICES [address] [place], [place], [address] Phone: [phone]   Fax:  [phone]

## 2018-07-10 ENCOUNTER — Ambulatory Visit: Payer: Medicare Other

## 2018-07-17 ENCOUNTER — Ambulatory Visit: Payer: Medicare Other

## 2018-07-19 ENCOUNTER — Ambulatory Visit: Payer: Medicare Other

## 2018-07-24 ENCOUNTER — Ambulatory Visit: Payer: Medicare Other

## 2018-07-29 NOTE — Therapy (Signed)
[place] [facility] MAIN [facility] SERVICES [address] [place], [place], [address] Phone: [phone]   Fax:  [phone]  Patient Details  Name: Nathan [name] MRN: [id number] Date of Birth: 01/09/1951 Referring Provider:  No ref. provider found  Encounter Date: 07/29/2018   The [facility] outpatient clinics are closed at this time due to COVID-19. The patient was contacted in regards to their therapy services. The pt currently refuses telehealth therapy services at this time. The patient is in agreement that he is safe and consents to being on hold for therapy services until the [facility] outpatient facilities reopen. At that time the patient will be contacted to schedule an appointment to resume therapy services.      Lynnea Maizes PT, DPT, GCS  Huprich,Jason 07/29/2018, 4:29 PM  [place] [facility] MAIN [facility] SERVICES [address] [place], [place], [address] Phone: [phone]   Fax:  [phone]

## 2018-07-31 ENCOUNTER — Ambulatory Visit: Payer: Medicare Other

## 2018-08-08 ENCOUNTER — Ambulatory Visit: Payer: Medicare Other

## 2018-08-15 ENCOUNTER — Ambulatory Visit: Payer: Medicare Other

## 2018-11-12 ENCOUNTER — Other Ambulatory Visit: Payer: Self-pay | Admitting: Internal Medicine

## 2018-11-12 DIAGNOSIS — R1011 Right upper quadrant pain: Secondary | ICD-10-CM

## 2018-11-13 ENCOUNTER — Other Ambulatory Visit: Payer: Self-pay

## 2018-11-13 ENCOUNTER — Ambulatory Visit: Payer: Medicare Other | Attending: Psychiatry | Admitting: Physical Therapy

## 2018-11-13 ENCOUNTER — Encounter: Payer: Self-pay | Admitting: Physical Therapy

## 2018-11-13 DIAGNOSIS — R2689 Other abnormalities of gait and mobility: Secondary | ICD-10-CM

## 2018-11-13 DIAGNOSIS — M6281 Muscle weakness (generalized): Secondary | ICD-10-CM | POA: Diagnosis present

## 2018-11-13 NOTE — Therapy (Signed)
[place] MAIN [facility] SERVICES [address] [place], [place], [address] Phone: [phone]   Fax:  [phone]  Physical Therapy Evaluation  Patient Details  Name: Nathan [name] MRN: [id number] Date of Birth: 1950-09-23 Referring Provider (PT): mark miller   Encounter Date: 11/13/2018  PT End of Session - 11/13/18 0818    Visit Number  1    Number of Visits  17    Date for PT Re-Evaluation  01/08/19    PT Start Time  0800    PT Stop Time  0900    PT Time Calculation (min)  60 min    Equipment Utilized During Treatment  Gait belt    Activity Tolerance  Patient tolerated treatment well       Past Medical History:  Diagnosis Date  . E. coli infection   . History of appendectomy     Past Surgical History:  Procedure Laterality Date  . cardiac catherization    . GASTRIC BYPASS    . upper blepharoplasty      There were no vitals filed for this visit.   Subjective Assessment - 11/13/18 0806    Subjective  Patient has had falls in his house. He is having gallbladder pain and having tests done to determine if he needs surgery.    Patient is accompained by:  Family member    Pertinent History  Nathan [name] is a right handed 68 y.o. male retiree with history of diabetes, hypertension , here for evaluation of lower extremity weakness. Patient reports weakness in legs began in December last year. 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 No headaches, no dizziness, no numbness of his legs, no back problems, has never had back surgery. Only recent thing is going on nitrofurantoin twice daily for persistent UTI. Looking for CIDV. He is now having galbladder pain and is doing tests to decide the next steps for care.    Limitations  Standing;Walking;House hold activities;Other (comment)    How long can you sit comfortably?  n/a    How long can you stand comfortably?  4-5 minutes    How long can you walk  comfortably?  challenging with inclines and stopping.     Diagnostic tests  MRI for cervical spine    Patient Stated Goals  want to walk safer, stop without falling.     Currently in Pain?  Yes    Pain Score  2     Pain Location  --   stomach   Pain Descriptors / Indicators  Aching    Pain Type  Acute pain    Pain Onset  In the past 7 days    Pain Frequency  Constant    Aggravating Factors   depends on what he eats    Pain Relieving Factors  bland diet    Effect of Pain on Daily Activities  nothing    Multiple Pain Sites  No         [facility] PT Assessment - 11/13/18 0811      Assessment   Medical Diagnosis  BLE weakness    Referring Provider (PT)  mark miller    Onset Date/Surgical Date  10/26/18    Hand Dominance  Right      Precautions   Precautions  Fall      Restrictions   Weight Bearing Restrictions  No      Balance Screen   Has the patient fallen in the  past 6 months  Yes    How many times?  3    Has the patient had a decrease in activity level because of a fear of falling?   Yes    Is the patient reluctant to leave their home because of a fear of falling?   No      Home Environment   Living Environment  Private residence    Living Arrangements  Spouse/significant other    Available Help at Discharge  Family    Type of [place] to enter    Entrance Stairs-Number of Steps  2    Entrance Stairs-Rails  None    Home Layout  Two level    Alternate Level Stairs-Number of Steps  12    Alternate Level Stairs-Rails  [place] - 4 wheels;Cane - single point;Grab bars - tub/shower      Prior Function   Level of Independence  Independent;Independent with basic ADLs;Independent with household mobility with device    Vocation  Retired    Leisure  likes to go watch football and basketball games,       Cognition   Overall Cognitive Status  Within Functional Limits for tasks assessed          POSTURE: WNL standing  and sitting   PROM/AROM:  STRENGTH:  Graded on a 0-5 scale Muscle Group Left Right                          Hip Flex 4/5 -4/5  Hip Abd 4.5 3+/5  Hip Add 2/5 2/5  Hip Ext 4/5 4/5      Knee Flex 5/5 5/5  Knee Ext 5/5 5/5  Ankle DF 2/5 2/5  Ankle PF 1/5 1/5   SENSATION:WNL BLE  FUNCTIONAL MOBILITY:  Transfer sit to stand without UE support Independent with bed mobility   BALANCE: Static Standing Balance  Normal Able to maintain standing balance against maximal resistance   Good Able to maintain standing balance against moderate resistance   Good-/Fair+ Able to maintain standing balance against minimal resistance x  Fair Able to stand unsupported without UE support and without LOB for 1-2 min   Fair- Requires Min A and UE support to maintain standing without loss of balance   Poor+ Requires mod A and UE support to maintain standing without loss of balance   Poor Requires max A and UE support to maintain standing balance without loss    Standing Dynamic Balance  Normal Stand independently unsupported, able to weight shift and cross midline maximally   Good Stand independently unsupported, able to weight shift and cross midline moderately x  Good-/Fair+ Stand independently unsupported, able to weight shift across midline minimally   Fair Stand independently unsupported, weight shift, and reach ipsilaterally, loss of balance when crossing midline   Poor+ Able to stand with Min A and reach ipsilaterally, unable to weight shift   Poor Able to stand with Mod A and minimally reach ipsilaterally, unable to cross midline.       GAIT: ambulates with spc and staggering gait and path deviation intermediate distances  OUTCOME MEASURES: TEST Outcome Interpretation  5 times sit<>stand 15.53sec >28 yo, >15 sec indicates increased risk for falls  10 meter walk test  .64               m/s <1.0 m/s indicates increased risk for  falls; limited community ambulator  Timed up and Go    13.60              sec <14 sec indicates increased risk for falls               Treatment: Ankle pumps/circles x 20  Hip abd/ER with RTB x 20 x 2  Cues for correct technique          Objective measurements completed on examination: See above findings.              PT Education - 11/13/18 0817    Education Details  plan of care    Person(s) Educated  Patient    Methods  Explanation    Comprehension  Verbalized understanding       PT Short Term Goals - 11/13/18 1058      PT SHORT TERM GOAL #1   Title  Patient will be independent in home exercise program to improve strength/mobility for better functional independence with ADLs.    Baseline  HEP compliant    Time  4    Period  Weeks    Status  New    Target Date  12/11/18      PT SHORT TERM GOAL #2   Title  Patient will deny any falls over past 2 weeks to demonstrate improved safety awareness at home and work.     Time  4    Period  Weeks    Status  New    Target Date  12/11/18        PT Long Term Goals - 11/13/18 1059      PT LONG TERM GOAL #1   Title  Patient will deny any falls over past 4 weeks to demonstrate improved safety awareness at home and work.     Time  8    Period  Weeks    Status  New    Target Date  01/08/19      PT LONG TERM GOAL #2   Title  Patient will increase dynamic gait index score to >19/24 as to demonstrate reduced fall risk and improved dynamic gait balance for better safety with community/home ambulation.     Time  8    Period  Weeks    Status  New    Target Date  01/08/19      PT LONG TERM GOAL #3   Title  Patient will increase BLE gross strength to 4+/5 as to improve functional strength for independent gait, increased standing tolerance and increased ADL ability.    Time  8    Period  Weeks    Status  New    Target Date  01/08/19      PT LONG TERM GOAL #4   Title  Patient will increase lower extremity functional scale to >50/80 to demonstrate improved  functional mobility and increased tolerance with ADLs.     Time  8    Period  Weeks    Status  New    Target Date  01/08/19      PT LONG TERM GOAL #5   Title  Patient will perform 4/5 sit to stand transitions without UE support and without loss of balance to reduce instability during transitions an dimprove patient comfort in public places such as restaurants.     Time  8    Period  Weeks    Status  New    Target Date  01/08/19  PT LONG TERM GOAL #6   Title  Patient will increase ABC scale score >80% to demonstrate better functional mobility and better confidence with ADLs.     Baseline  12/19: 53% 2/12: 60.7%    Time  8    Period  Weeks    Status  Partially Met      PT LONG TERM GOAL #7   Title  Patient will demonstrate improved static stability by being able to maintain COM for >60 seconds without LOB upon abrupt termination of movement and/or with static stand.     Baseline  2/12: 13 seconds    Time  8    Period  Weeks    Status  New             Plan - 11/13/18 1103    Clinical Impression Statement  Patient has decreased strength BLE hips and ankles , decreased gait with instability, and decreased static and dynamic standing balance. He has a falls risk indicated by outcome measures and also has hx of falls. He will benefit from skilled PT to improve balance, strength and safety.    Personal Factors and Comorbidities  Age;Comorbidity 2;Past/Current Experience    Examination-Activity Limitations  Carry;Stand;Transfers    Examination-Participation Restrictions  Yard Work;Cleaning;Meal Prep    Stability/Clinical Decision Making  Stable/Uncomplicated    Clinical Decision Making  Low    Rehab Potential  Good    PT Frequency  2x / week    PT Duration  8 weeks    PT Treatment/Interventions  ADLs/Self Care Home Management;Aquatic Therapy;Cryotherapy;Ultrasound;Traction;Moist Heat;Electrical Stimulation;DME Instruction;Gait training;Stair training;Functional mobility  training;Neuromuscular re-education;Balance training;Therapeutic exercise;Therapeutic activities;Patient/family education;Orthotic Fit/Training;Manual techniques;Dry needling;Passive range of motion;Compression bandaging;Manual lymph drainage;Energy conservation;Splinting;Taping;Vasopneumatic Device;Visual/perceptual remediation/compensation;Vestibular    PT Next Visit Plan  ankle strategy, foot stability, initiation/termination of movement, BLE strength, dynamic mobility/balance    Consulted and Agree with Plan of Care  Patient;Family member/caregiver       Patient will benefit from skilled therapeutic intervention in order to improve the following deficits and impairments:  Abnormal gait, Cardiopulmonary status limiting activity, Decreased activity tolerance, Decreased balance, Decreased knowledge of precautions, Decreased endurance, Decreased mobility, Difficulty walking, Decreased strength, Increased edema, Impaired flexibility, Impaired perceived functional ability, Postural dysfunction, Improper body mechanics  Visit Diagnosis: 1. Muscle weakness (generalized)   2. Other abnormalities of gait and mobility        Problem List There are no active problems to display for this patient.   [address], [place] DPT 11/13/2018, 11:11 AM  [place] MAIN [facility] SERVICES [address] [place], [place], [address] Phone: [phone]   Fax:  [phone]  Name: Nathan [name] MRN: [id number] Date of Birth: 1950-07-02

## 2018-11-15 ENCOUNTER — Ambulatory Visit: Payer: Medicare Other

## 2018-11-18 ENCOUNTER — Ambulatory Visit
Admission: RE | Admit: 2018-11-18 | Discharge: 2018-11-18 | Disposition: A | Discharge status: Home / Self Care | Payer: Medicare Other | Source: Ambulatory Visit | Attending: Internal Medicine | Admitting: Internal Medicine

## 2018-11-18 ENCOUNTER — Other Ambulatory Visit: Payer: Self-pay

## 2018-11-18 DIAGNOSIS — R1011 Right upper quadrant pain: Secondary | ICD-10-CM

## 2018-11-18 MED ORDER — TECHNETIUM TC 99M MEBROFENIN IV KIT
5.0000 | PACK | Freq: Once | INTRAVENOUS | Status: AC | PRN
  Administered 2018-11-18: 09:00:00 5.305 via INTRAVENOUS

## 2018-11-20 ENCOUNTER — Ambulatory Visit: Payer: Medicare Other

## 2018-11-27 ENCOUNTER — Ambulatory Visit: Payer: Medicare Other

## 2018-12-04 ENCOUNTER — Ambulatory Visit: Payer: Medicare Other

## 2018-12-11 ENCOUNTER — Ambulatory Visit: Payer: Medicare Other

## 2018-12-18 ENCOUNTER — Ambulatory Visit: Payer: Medicare Other

## 2018-12-25 ENCOUNTER — Ambulatory Visit: Payer: Medicare Other

## 2019-01-01 ENCOUNTER — Ambulatory Visit: Payer: Medicare Other

## 2019-01-08 ENCOUNTER — Ambulatory Visit: Payer: Medicare Other

## 2019-01-15 ENCOUNTER — Ambulatory Visit: Payer: Medicare Other

## 2019-09-25 IMAGING — CT CT CERVICAL SPINE W/O CM
5 of 9 series · 12 of 33 positions shown, 13 images · non-contrast
Comparison: None.

CLINICAL DATA: Imbalance

EXAM:
CT HEAD WITHOUT CONTRAST
CT CERVICAL SPINE WITHOUT CONTRAST
TECHNIQUE: Multidetector CT imaging of the head and cervical spine was
performed following the standard protocol without intravenous
contrast. Multiplanar CT image reconstructions of the cervical spine
were also generated.

[Series 10: axial bone c-spine · axial · 0.26mm/px · z∈[-722,-660]mm · 2 of 104 slices shown, 3 images]
[im 35/104  soft-tissue]
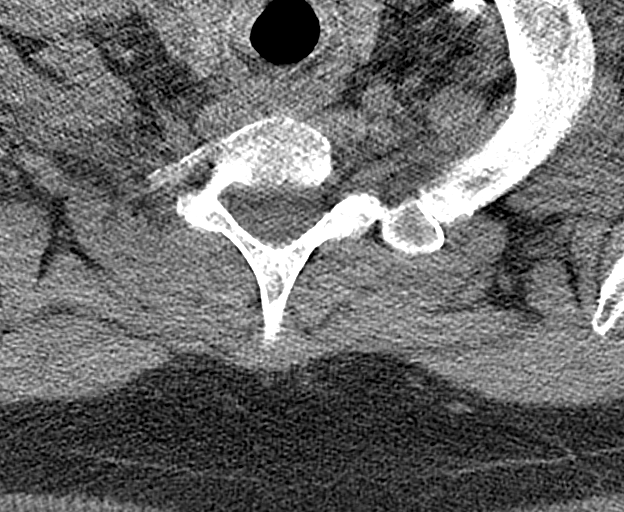
[im 35/104  bone]
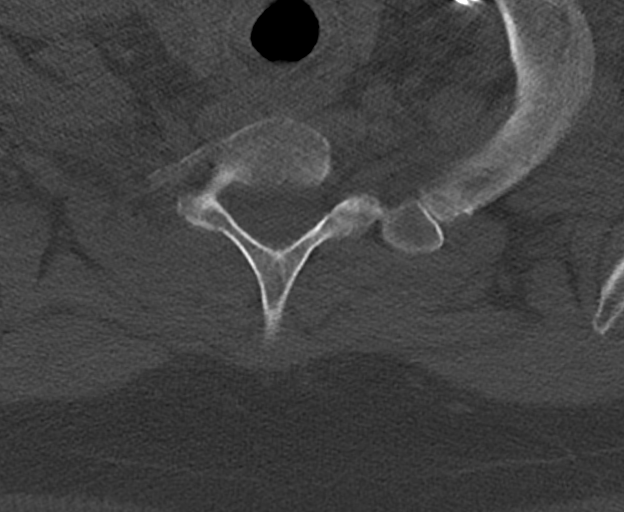
[im 69/104  bone]
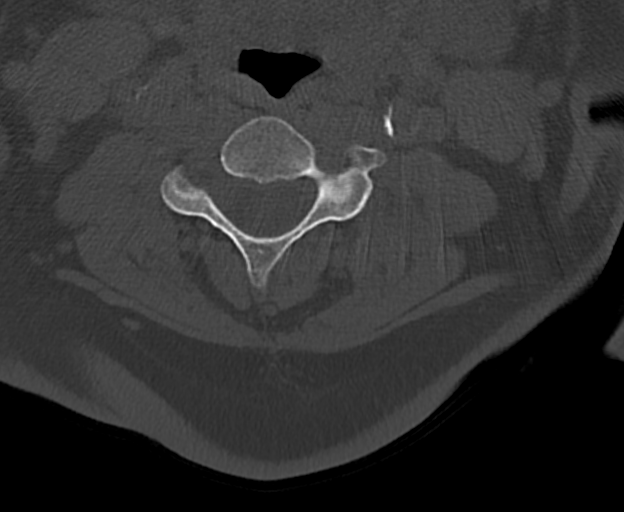

[Series 12: axial (person_name) · axial · 0.23mm/px · z∈[-713,-645]mm · 2 of 109 slices shown]
[im 37/109  bone]
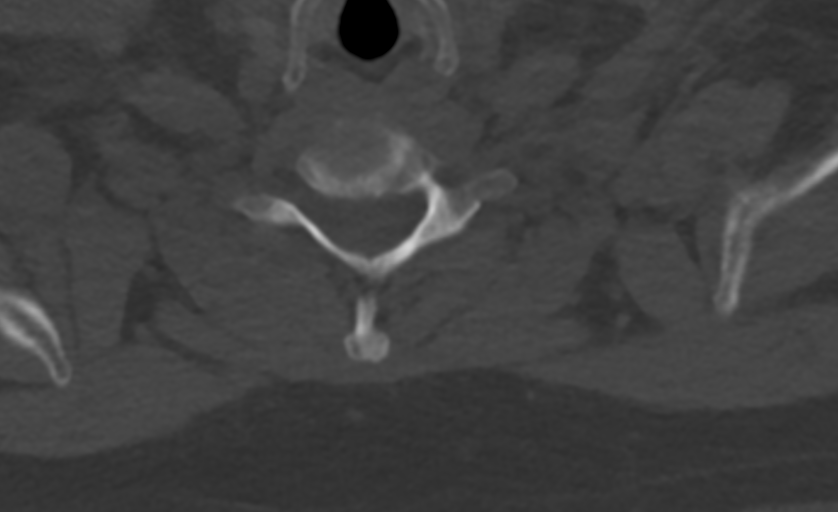
[im 73/109  bone]
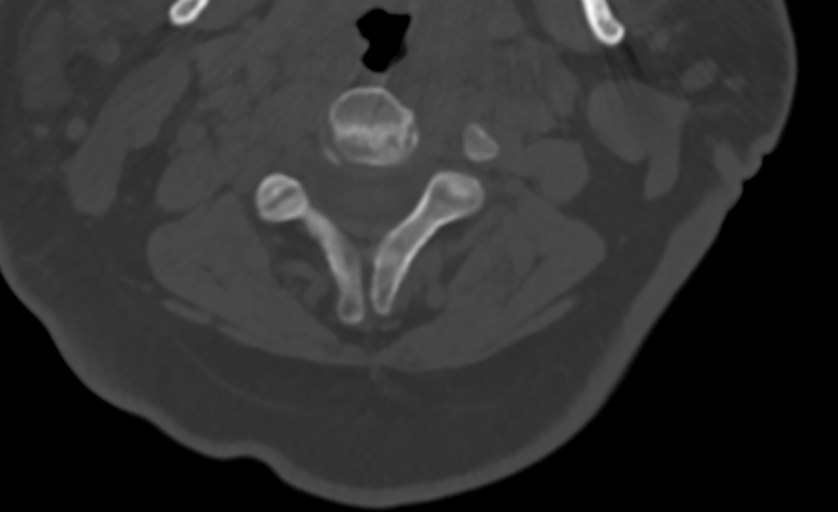

[Series 14: sag bone c-spine · sagittal · 0.23mm/px · 5 of 97 slices shown]
[im 14/97  bone]
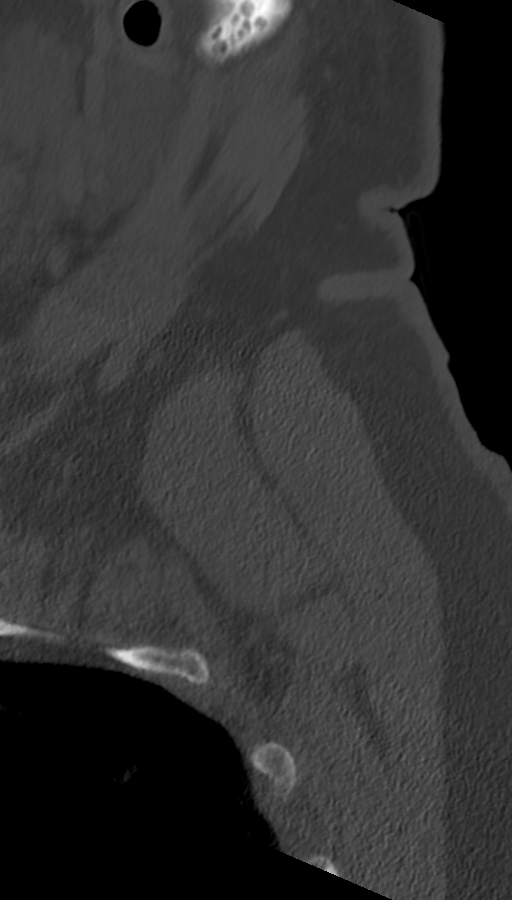
[im 28/97  bone]
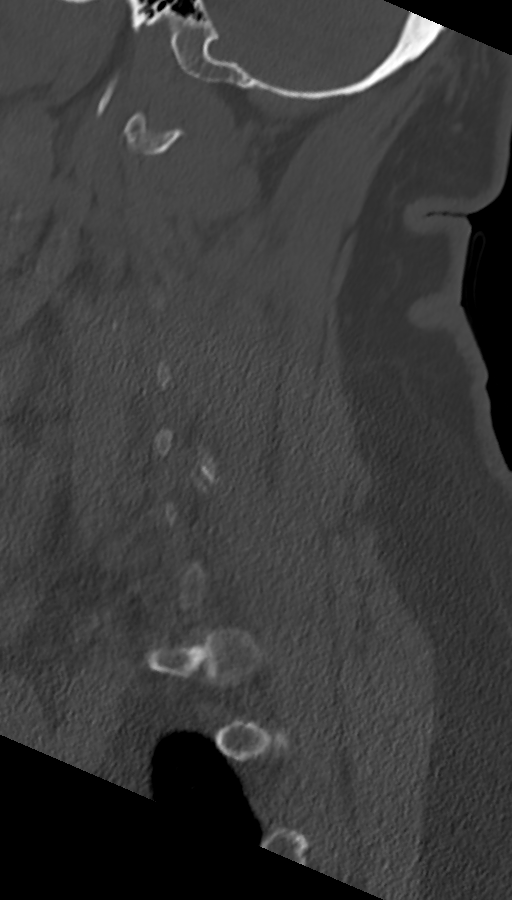
[im 42/97  bone]
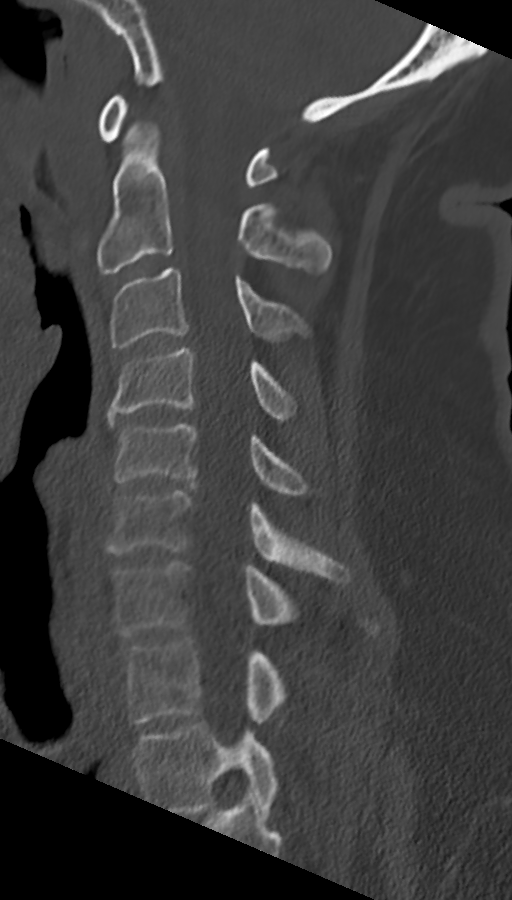
[im 55/97  bone]
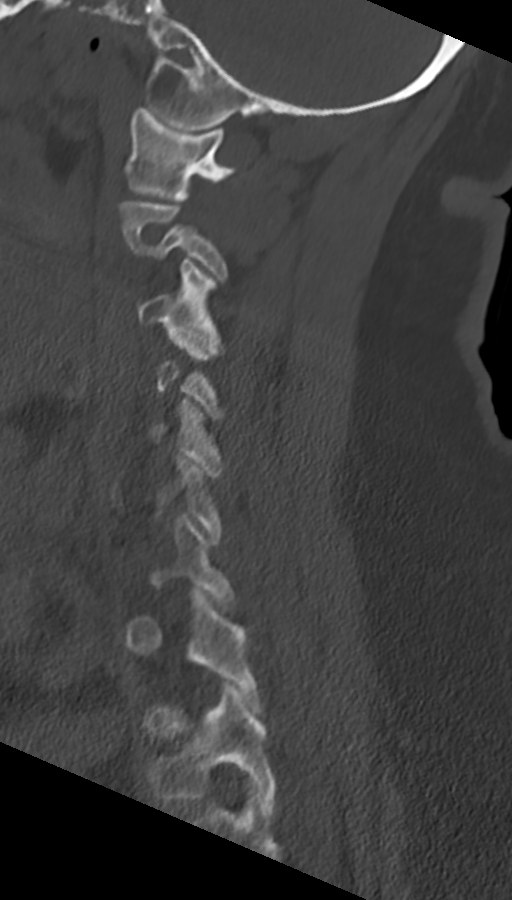
[im 69/97  bone]
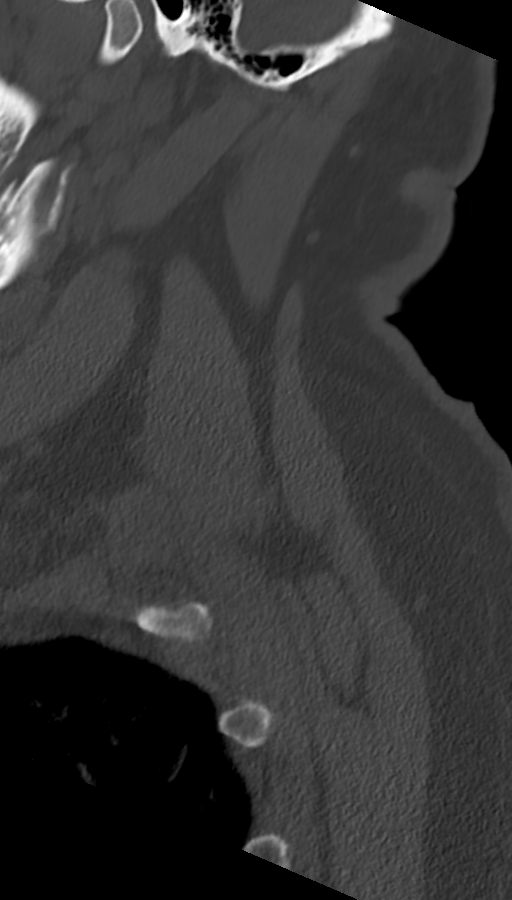

[Series 16: cor bone c-spine · coronal · 0.38mm/px · 1 of 59 slices shown]
[im 30/59  bone]
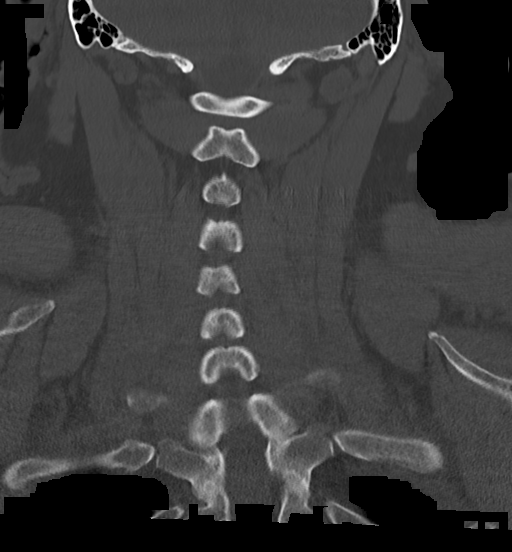

[Series 18: orthogonal axial c-spine · axial · 0.23mm/px · z∈[-723,-658]mm · 2 of 105 slices shown]
[im 35/105  bone]
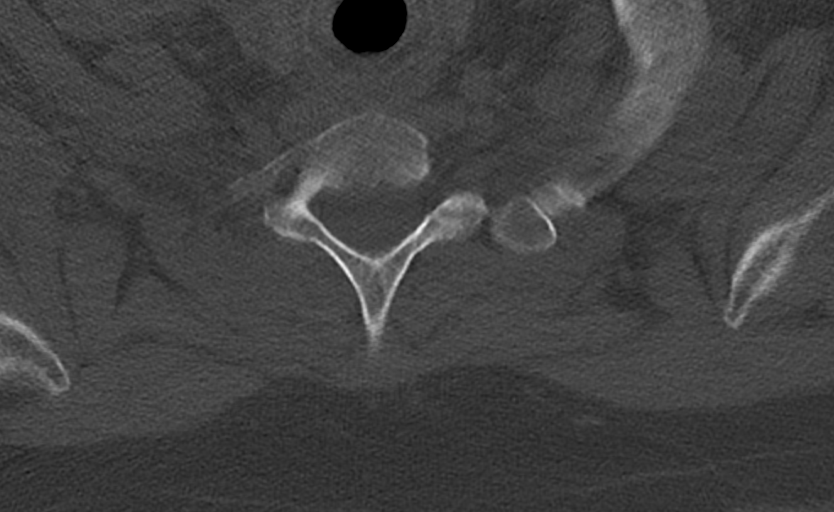
[im 70/105  bone]
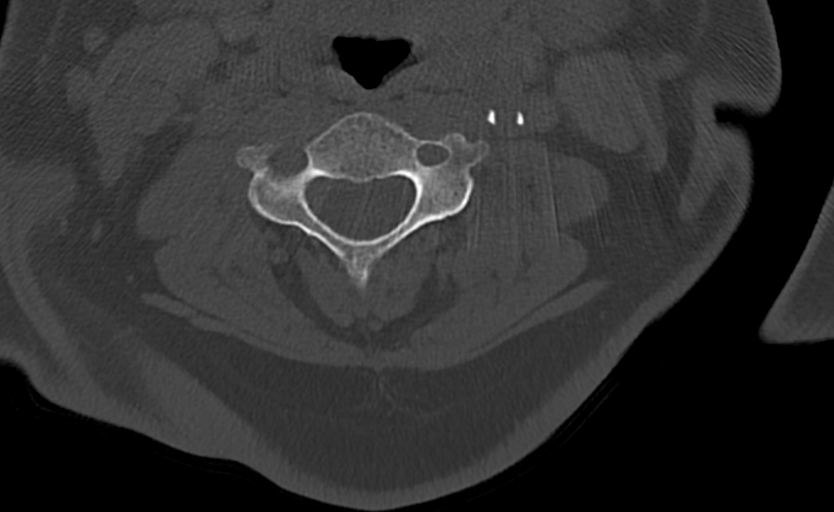

[12 of 33 positions shown; findings below may reference images not displayed]

FINDINGS: CT HEAD FINDINGS

Brain: Mild atrophy. Negative for hydrocephalus. Negative for
infarct, hemorrhage, mass.

Vascular: Negative for hyperdense vessel

Skull: Negative

Sinuses/Orbits: Negative

Other: None

CT CERVICAL SPINE FINDINGS

Alignment: Mild anterolisthesis C2-3 and C3-4. Mild cervical
kyphosis.

Skull base and vertebrae: Negative for fracture.

Soft tissues and spinal canal: Negative

Disc levels: Mild disc degeneration and spurring C4-5 and C5-6.
Central prominent osteophyte C7-T1

Upper chest: Negative

Other: None
IMPRESSION: 1. Mild atrophy.  No acute intracranial abnormality
2. Negative for cervical spine fracture. Mild degenerative change in
the cervical spine.

## 2023-09-11 ENCOUNTER — Encounter: Payer: Self-pay | Admitting: Internal Medicine

## 2023-09-11 ENCOUNTER — Inpatient Hospital Stay
Admission: EM | Admit: 2023-09-11 | Discharge: 2023-09-13 | DRG: 381 | Disposition: A | Discharge status: Home / Self Care | Attending: Family Medicine | Admitting: Family Medicine

## 2023-09-11 ENCOUNTER — Emergency Department

## 2023-09-11 ENCOUNTER — Other Ambulatory Visit: Payer: Self-pay

## 2023-09-11 DIAGNOSIS — R627 Adult failure to thrive: Secondary | ICD-10-CM | POA: Diagnosis present

## 2023-09-11 DIAGNOSIS — G4700 Insomnia, unspecified: Secondary | ICD-10-CM | POA: Diagnosis present

## 2023-09-11 DIAGNOSIS — F32A Depression, unspecified: Secondary | ICD-10-CM | POA: Diagnosis present

## 2023-09-11 DIAGNOSIS — Z91128 Patient's intentional underdosing of medication regimen for other reason: Secondary | ICD-10-CM

## 2023-09-11 DIAGNOSIS — Z9884 Bariatric surgery status: Secondary | ICD-10-CM

## 2023-09-11 DIAGNOSIS — I5032 Chronic diastolic (congestive) heart failure: Secondary | ICD-10-CM | POA: Diagnosis present

## 2023-09-11 DIAGNOSIS — K222 Esophageal obstruction: Secondary | ICD-10-CM | POA: Diagnosis present

## 2023-09-11 DIAGNOSIS — Z7982 Long term (current) use of aspirin: Secondary | ICD-10-CM

## 2023-09-11 DIAGNOSIS — Z7985 Long-term (current) use of injectable non-insulin antidiabetic drugs: Secondary | ICD-10-CM

## 2023-09-11 DIAGNOSIS — E876 Hypokalemia: Secondary | ICD-10-CM | POA: Diagnosis present

## 2023-09-11 DIAGNOSIS — R1310 Dysphagia, unspecified: Secondary | ICD-10-CM

## 2023-09-11 DIAGNOSIS — Z6831 Body mass index (BMI) 31.0-31.9, adult: Secondary | ICD-10-CM

## 2023-09-11 DIAGNOSIS — I3139 Other pericardial effusion (noninflammatory): Secondary | ICD-10-CM | POA: Diagnosis present

## 2023-09-11 DIAGNOSIS — E785 Hyperlipidemia, unspecified: Secondary | ICD-10-CM | POA: Diagnosis present

## 2023-09-11 DIAGNOSIS — M47816 Spondylosis without myelopathy or radiculopathy, lumbar region: Secondary | ICD-10-CM | POA: Diagnosis present

## 2023-09-11 DIAGNOSIS — N400 Enlarged prostate without lower urinary tract symptoms: Secondary | ICD-10-CM | POA: Diagnosis present

## 2023-09-11 DIAGNOSIS — K219 Gastro-esophageal reflux disease without esophagitis: Secondary | ICD-10-CM | POA: Diagnosis present

## 2023-09-11 DIAGNOSIS — K289 Gastrojejunal ulcer, unspecified as acute or chronic, without hemorrhage or perforation: Secondary | ICD-10-CM | POA: Diagnosis present

## 2023-09-11 DIAGNOSIS — I480 Paroxysmal atrial fibrillation: Secondary | ICD-10-CM | POA: Diagnosis present

## 2023-09-11 DIAGNOSIS — E119 Type 2 diabetes mellitus without complications: Secondary | ICD-10-CM | POA: Diagnosis present

## 2023-09-11 DIAGNOSIS — K279 Peptic ulcer, site unspecified, unspecified as acute or chronic, without hemorrhage or perforation: Secondary | ICD-10-CM | POA: Diagnosis not present

## 2023-09-11 DIAGNOSIS — Z79899 Other long term (current) drug therapy: Secondary | ICD-10-CM

## 2023-09-11 DIAGNOSIS — R634 Abnormal weight loss: Secondary | ICD-10-CM | POA: Diagnosis present

## 2023-09-11 DIAGNOSIS — Z7901 Long term (current) use of anticoagulants: Secondary | ICD-10-CM

## 2023-09-11 DIAGNOSIS — K2100 Gastro-esophageal reflux disease with esophagitis, without bleeding: Secondary | ICD-10-CM | POA: Diagnosis not present

## 2023-09-11 DIAGNOSIS — R1013 Epigastric pain: Principal | ICD-10-CM

## 2023-09-11 DIAGNOSIS — K449 Diaphragmatic hernia without obstruction or gangrene: Secondary | ICD-10-CM | POA: Diagnosis present

## 2023-09-11 DIAGNOSIS — I700 Atherosclerosis of aorta: Secondary | ICD-10-CM | POA: Diagnosis present

## 2023-09-11 DIAGNOSIS — I2510 Atherosclerotic heart disease of native coronary artery without angina pectoris: Secondary | ICD-10-CM | POA: Diagnosis present

## 2023-09-11 DIAGNOSIS — K9289 Other specified diseases of the digestive system: Secondary | ICD-10-CM | POA: Diagnosis present

## 2023-09-11 DIAGNOSIS — Z7984 Long term (current) use of oral hypoglycemic drugs: Secondary | ICD-10-CM

## 2023-09-11 DIAGNOSIS — K9189 Other postprocedural complications and disorders of digestive system: Secondary | ICD-10-CM | POA: Diagnosis not present

## 2023-09-11 DIAGNOSIS — Z950 Presence of cardiac pacemaker: Secondary | ICD-10-CM

## 2023-09-11 DIAGNOSIS — E66811 Obesity, class 1: Secondary | ICD-10-CM | POA: Diagnosis present

## 2023-09-11 DIAGNOSIS — Z87891 Personal history of nicotine dependence: Secondary | ICD-10-CM

## 2023-09-11 DIAGNOSIS — I10 Essential (primary) hypertension: Secondary | ICD-10-CM | POA: Insufficient documentation

## 2023-09-11 DIAGNOSIS — Z8249 Family history of ischemic heart disease and other diseases of the circulatory system: Secondary | ICD-10-CM

## 2023-09-11 DIAGNOSIS — I110 Hypertensive heart disease with heart failure: Secondary | ICD-10-CM | POA: Diagnosis present

## 2023-09-11 DIAGNOSIS — Z792 Long term (current) use of antibiotics: Secondary | ICD-10-CM

## 2023-09-11 LAB — URINALYSIS, ROUTINE W REFLEX MICROSCOPIC
Bacteria, UA: NONE SEEN
Bilirubin Urine: NEGATIVE
Glucose, UA: 150 mg/dL — AB
Hgb urine dipstick: NEGATIVE
Ketones, ur: 5 mg/dL — AB
Leukocytes,Ua: NEGATIVE
Nitrite: NEGATIVE
Protein, ur: 30 mg/dL — AB
Specific Gravity, Urine: 1.038 — ABNORMAL HIGH (ref 1.005–1.030)
pH: 5 (ref 5.0–8.0)

## 2023-09-11 LAB — LIPASE, BLOOD: Lipase: 24 U/L (ref 11–51)

## 2023-09-11 LAB — COMPREHENSIVE METABOLIC PANEL WITH GFR
ALT: 21 U/L (ref 0–44)
AST: 30 U/L (ref 15–41)
Albumin: 2.7 g/dL — ABNORMAL LOW (ref 3.5–5.0)
Alkaline Phosphatase: 77 U/L (ref 38–126)
Anion gap: 9 (ref 5–15)
BUN: 27 mg/dL — ABNORMAL HIGH (ref 8–23)
CO2: 27 mmol/L (ref 22–32)
Calcium: 9.3 mg/dL (ref 8.9–10.3)
Chloride: 103 mmol/L (ref 98–111)
Creatinine, Ser: 0.96 mg/dL (ref 0.61–1.24)
GFR, Estimated: >60 mL/min (ref >60)
Glucose, Bld: 105 mg/dL — ABNORMAL HIGH (ref 70–99)
Potassium: 4.2 mmol/L (ref 3.5–5.1)
Sodium: 139 mmol/L (ref 135–145)
Total Bilirubin: 1.2 mg/dL (ref 0.0–1.2)
Total Protein: 5.8 g/dL — ABNORMAL LOW (ref 6.5–8.1)

## 2023-09-11 LAB — HEMOGLOBIN A1C
Hgb A1c MFr Bld: 5.2 % (ref 4.8–5.6)
Mean Plasma Glucose: 102.54 mg/dL

## 2023-09-11 LAB — CBC
HCT: 45.8 % (ref 39.0–52.0)
Hemoglobin: 15.2 g/dL (ref 13.0–17.0)
MCH: 34.1 pg — ABNORMAL HIGH (ref 26.0–34.0)
MCHC: 33.2 g/dL (ref 30.0–36.0)
MCV: 102.7 fL — ABNORMAL HIGH (ref 80.0–100.0)
Platelets: 183 10*3/uL (ref 150–400)
RBC: 4.46 MIL/uL (ref 4.22–5.81)
RDW: 13.5 % (ref 11.5–15.5)
WBC: 8.9 10*3/uL (ref 4.0–10.5)
nRBC: 0 % (ref 0.0–0.2)

## 2023-09-11 LAB — TROPONIN I (HIGH SENSITIVITY): Troponin I (High Sensitivity): 68 ng/L — ABNORMAL HIGH (ref <18)

## 2023-09-11 LAB — GLUCOSE, CAPILLARY: Glucose-Capillary: 86 mg/dL (ref 70–99)

## 2023-09-11 MED ORDER — TEMAZEPAM 7.5 MG PO CAPS
30.0000 mg | ORAL_CAPSULE | Freq: Every evening | ORAL | Status: DC | PRN
  Administered 2023-09-11 – 2023-09-12 (×2): 30 mg via ORAL
  Filled 2023-09-11 (×2): qty 4

## 2023-09-11 MED ORDER — ONDANSETRON HCL 4 MG PO TABS: 4.0000 mg | ORAL_TABLET | Freq: Four times a day (QID) | ORAL | Status: DC | PRN

## 2023-09-11 MED ORDER — SODIUM CHLORIDE 0.9 % IV SOLN: INTRAVENOUS | Status: DC

## 2023-09-11 MED ORDER — IOHEXOL 300 MG/ML  SOLN
100.0000 mL | Freq: Once | INTRAMUSCULAR | Status: AC | PRN
Start: 2023-09-11 — End: 2023-09-11
  Administered 2023-09-11: 100 mL via INTRAVENOUS

## 2023-09-11 MED ORDER — ACETAMINOPHEN 650 MG RE SUPP: 650.0000 mg | Freq: Four times a day (QID) | RECTAL | Status: DC | PRN

## 2023-09-11 MED ORDER — ACETAMINOPHEN 325 MG PO TABS
650.0000 mg | ORAL_TABLET | Freq: Four times a day (QID) | ORAL | Status: DC | PRN
Start: 2023-09-11 — End: 2023-09-16

## 2023-09-11 MED ORDER — LACTATED RINGERS IV BOLUS
1000.0000 mL | Freq: Once | INTRAVENOUS | Status: AC
  Administered 2023-09-11: 1000 mL via INTRAVENOUS

## 2023-09-11 MED ORDER — ONDANSETRON HCL 4 MG/2ML IJ SOLN: 4.0000 mg | Freq: Four times a day (QID) | INTRAMUSCULAR | Status: DC | PRN

## 2023-09-11 MED ORDER — HEPARIN SODIUM (PORCINE) 5000 UNIT/ML IJ SOLN
5000.0000 [IU] | Freq: Three times a day (TID) | INTRAMUSCULAR | Status: AC
  Administered 2023-09-11: 5000 [IU] via SUBCUTANEOUS
  Filled 2023-09-11: qty 1

## 2023-09-11 MED ORDER — HYDRALAZINE HCL 20 MG/ML IJ SOLN: 5.0000 mg | Freq: Four times a day (QID) | INTRAMUSCULAR | Status: DC | PRN

## 2023-09-11 MED ORDER — INSULIN ASPART 100 UNIT/ML IJ SOLN: 0.0000 [IU] | Freq: Three times a day (TID) | INTRAMUSCULAR | Status: DC

## 2023-09-11 MED ORDER — PANTOPRAZOLE SODIUM 40 MG IV SOLR
40.0000 mg | Freq: Once | INTRAVENOUS | Status: AC
  Administered 2023-09-11: 40 mg via INTRAVENOUS
  Filled 2023-09-11: qty 10

## 2023-09-11 MED ORDER — INSULIN ASPART 100 UNIT/ML IJ SOLN: 0.0000 [IU] | Freq: Every day | INTRAMUSCULAR | Status: DC

## 2023-09-11 MED ORDER — PANTOPRAZOLE SODIUM 40 MG IV SOLR
40.0000 mg | Freq: Every day | INTRAVENOUS | Status: AC
  Administered 2023-09-12: 40 mg via INTRAVENOUS
  Filled 2023-09-11: qty 10

## 2023-09-11 NOTE — Assessment & Plan Note (Signed)
 Home temazepam 30 mg as needed for sleep can be taken with milk resumed on admission

## 2023-09-11 NOTE — Assessment & Plan Note (Addendum)
 Home sertraline 100 mg p.o. daily not resumed on admission AM team to resume when the benefits outweigh the risk

## 2023-09-11 NOTE — Hospital Course (Addendum)
 Mr. Nathan [name] is a 73 year old male with history of hypertension, hyperlipidemia, non-insulin-dependent diabetes mellitus, atrial fibrillation, who presents emergency department for chief concerns of epigastric pain that started since Thursday.  He reports he has been unable to tolerate any p.o. intake including pills and food since that time.  Vitals in the ED showed t of 98.5, respiration rate 16, heart rate 87, blood pressure 129/77, SpO2 of 95% on room air.  Serum sodium is 139, potassium 4.2, chloride 103, bicarb 27, BUN of 27, serum creatinine 0.96, EGFR greater than 60, nonfasting blood glucose 105, WBC 8.9, hemoglobin 15.2, platelets of 183.  ED treatment: Protonix 40 mg IV one-time dose, LR 1 L bolus.

## 2023-09-11 NOTE — ED Notes (Signed)
 Report received from Jessica D RN, assuming pt care.

## 2023-09-11 NOTE — ED Triage Notes (Signed)
 Pt arrives via [facility] with complaints of epigastric pain that started last Thursday. Pt also states that they've lost their appetite a few weeks ago. Pt has not been able to swallow anything since Thursday, has not been able to take any of their PO medications. Pt is A&Ox4 during triage.

## 2023-09-11 NOTE — Assessment & Plan Note (Addendum)
 Hydrochlorothiazide 25 mg daily, benazepril 10 mg daily, amlodipine 5 mg daily, were not resumed on admission AM team to resume when the benefits outweigh the risk Hydralazine 5 mg IV every 6 hours as needed for SBP greater 165, 5 days ordered

## 2023-09-11 NOTE — ED Notes (Signed)
 Mumma MD at bedside

## 2023-09-11 NOTE — ED Provider Notes (Signed)
 [facility] Provider Note    Event Date/Time   First MD Initiated Contact with Patient 09/11/23 1225     (approximate)   History   Chief Complaint Abdominal Pain   HPI  Nathan [name] is a 73 y.o. male with past medical history of hypertension, hyperlipidemia, diabetes, atrial fibrillation, and Roux-en-Y gastric bypass who presents to the ED complaining of abdominal pain.  Patient reports that he has been dealing with multiple weeks of intermittent epigastric pain that has seemed to get acutely worse over the past 5 days.  He states he has severe pain in his epigastrium that feels like a stabbing if he tries to eat or drink anything.  He has been unable to tolerate solids for the past week, states it feels like they get stuck and immediately come back up.  He has been able to tolerate small amounts of milk, but this is the only liquid he has been able to tolerate.  He has lost over 25 pounds since the onset of symptoms, was seen by his PCP today and referred to the ED for further evaluation.     Physical Exam   Triage Vital Signs: ED Triage Vitals  Encounter Vitals Group     BP 09/11/23 1210 95/63     Systolic BP Percentile --      Diastolic BP Percentile --      Pulse Rate 09/11/23 1210 87     Resp 09/11/23 1210 16     Temp 09/11/23 1210 98.6 F (37 C)     Temp Source 09/11/23 1210 Oral     SpO2 09/11/23 1210 95 %     Weight 09/11/23 1213 206 lb (93.4 kg)     Height 09/11/23 1213 5\' 9"  (1.753 m)     Head Circumference --      Peak Flow --      Pain Score 09/11/23 1211 4     Pain Loc --      Pain Education --      Exclude from Growth Chart --     Most recent vital signs: Vitals:   09/11/23 1210  BP: 95/63  Pulse: 87  Resp: 16  Temp: 98.6 F (37 C)  SpO2: 95%    Constitutional: Alert and oriented. Eyes: Conjunctivae are normal. Head: Atraumatic. Nose: No congestion/rhinnorhea. Mouth/Throat: Mucous membranes are moist.   Cardiovascular: Normal rate, regular rhythm. Grossly normal heart sounds.  2+ radial pulses bilaterally. Respiratory: Normal respiratory effort.  No retractions. Lungs CTAB. Gastrointestinal: Soft and nontender. No distention. Musculoskeletal: No lower extremity tenderness nor edema.  Neurologic:  Normal speech and language. No gross focal neurologic deficits are appreciated.    ED Results / Procedures / Treatments   Labs (all labs ordered are listed, but only abnormal results are displayed) Labs Reviewed  LIPASE, BLOOD  COMPREHENSIVE METABOLIC PANEL WITH GFR  CBC  URINALYSIS, ROUTINE W REFLEX MICROSCOPIC     EKG  ED ECG REPORT I, Twilla Galea, the attending physician, personally viewed and interpreted this ECG.   Date: 09/11/2023  EKG Time: 12:28  Rate: 83  Rhythm: Atrial fibrillation with occasional paced complexes  Axis: Normal  Intervals:right bundle branch block  ST&T Change: None  PROCEDURES:  Critical Care performed: No  Procedures   MEDICATIONS ORDERED IN ED: Medications  lactated ringers bolus 1,000 mL (1,000 mLs Intravenous New Bag/Given 09/11/23 1312)     IMPRESSION / MDM / ASSESSMENT AND PLAN / ED COURSE  I reviewed  the triage vital signs and the nursing notes.                              73 y.o. male with past medical history of hypertension, hyperlipidemia, diabetes, atrial fibrillation, and Roux-en-Y gastric bypass who presents to the ED complaining of epigastric pain with eating and unable to tolerate liquids or solids over the past week.  Patient's presentation is most consistent with acute presentation with potential threat to life or bodily function.  Differential diagnosis includes, but is not limited to, bowel obstruction, esophageal stenosis, PUD, gastritis, dehydration, electrolyte abnormality, AKI, hepatitis, pancreatitis.  Patient nontoxic-appearing and in no acute distress, vital signs are unremarkable.  He reports pain primarily  when eating, has a benign abdominal exam.  Given his prior gastric bypass, will further assess with CT imaging to ensure no complication.  Lab results are pending at this time, will hydrate with IV fluids but patient declines pain or nausea medication.  Results delayed by difficult IV access, IV now in place with assistance of IV team.  Patient turned over to oncoming provider pending lab results and CT results, will plan for admission.      FINAL CLINICAL IMPRESSION(S) / ED DIAGNOSES   Final diagnoses:  Epigastric pain  Dysphagia, unspecified type     Rx / DC Orders   ED Discharge Orders     None        Note:  This document was prepared using Dragon voice recognition software and may include unintentional dictation errors.   Twilla Galea, MD 09/11/23 [phone]

## 2023-09-11 NOTE — ED Provider Notes (Addendum)
 Care assumed of patient from outgoing provider.  See their note for initial history, exam and plan.  Clinical Course as of 09/11/23 1858  Tue Sep 11, 2023  1509 History of gastric bypass (2005), pain with eating, not tolerating PO. Loss of 25 lbs. Plan to admit for failure to thrive. CT and labs pending.  [SM]    Clinical Course User Index [SM] Viviano Ground, MD     Patient states that over the past 5 days he has not been able to tolerate liquids or solids and not been able to take any of his home medications.  Feels like it gets stuck and then immediately comes back up.  Does have an ongoing globus sensation.  Was able to keep down some milk.  CT scan without acute findings.  Consulted hospitalist for admission for further evaluation of failure to thrive and esophageal dysmotility.  Given IV Protonix. Viviano Ground, MD 09/11/23 1558    Viviano Ground, MD 09/11/23 9147

## 2023-09-11 NOTE — Assessment & Plan Note (Signed)
-  Simvastatin 40 mg daily not resumed on admission

## 2023-09-11 NOTE — H&P (Addendum)
 History and Physical   Nathan [name]:[id number] DOB: Jul 23, 1950 DOA: 09/11/2023  PCP: Sari Cunning, MD  Outpatient Specialists: Dr. Aleck Hurdle, [facility] Patient coming from: [facility] clinic  I have personally briefly reviewed patient's old medical records in [facility] EMR.  Chief Concern: Epigastric pain, inability to swallow anything since Thursday  HPI: Nathan [name] is a 73 year old male with history of hypertension, hyperlipidemia, non-insulin-dependent diabetes mellitus, atrial fibrillation, who presents emergency department for chief concerns of epigastric pain that started since Thursday.  He reports he has been unable to tolerate any p.o. intake including pills and food since that time.  Vitals in the ED showed temperature of 98.5, respiration rate 16, heart rate 87, blood pressure 129/77, SpO2 of 95% on room air.  Serum sodium is 139, potassium 4.2, chloride 103, bicarb 27, BUN of 27, serum creatinine 0.96, EGFR greater than 60, nonfasting blood glucose 105, WBC 8.9, hemoglobin 15.2, platelets of 183.  ED treatment: Protonix 40 mg IV one-time dose, LR 1 L bolus. ------------------------------------------ At bedside, patient able to tell me his first and last name, age, location, current calendar year.  He reports that he has been having epigastric discomfort since Thursday.  He has not been able to tolerate any of his medications especially his heart medications since Thursday.  He has only been able to take his sleeping medication at night when he dissolves it with milk.  He denies fever, chills, chest pain, shortness of breath, feeling this way before.  He denies trauma to his person.  Social history: He lives at home with his wife of 53 years.  He is a former tobacco user quitting more than 40 years ago.  He denies EtOH and recreational drug use.  He is retired.  ROS: Constitutional: no weight change, no fever ENT/Mouth: no sore throat, no rhinorrhea Eyes: no  eye pain, no vision changes Cardiovascular: no chest pain, no dyspnea,  no edema, no palpitations Respiratory: no cough, no sputum, no wheezing Gastrointestinal: no nausea, no vomiting, no diarrhea, no constipation Genitourinary: no urinary incontinence, no dysuria, no hematuria Musculoskeletal: no arthralgias, no myalgias Skin: no skin lesions, no pruritus, Neuro: + weakness, no loss of consciousness, no syncope Psych: no anxiety, no depression, + decrease appetite Heme/Lymph: no bruising, no bleeding  ED Course: Discussed with EDP, patient requiring hospitalization for chief concerns of gastrointestinal dysmotility.  Assessment/Plan  Principal Problem:   Gastrointestinal dysmotility Active Problems:   Essential hypertension   Depression   Hyperlipidemia   GERD (gastroesophageal reflux disease)   Insomnia   Diabetes mellitus type 2, noninsulin dependent (HCC)   Assessment and Plan:  * Gastrointestinal dysmotility Suspect esophageal dysmotility Gastroenterology service has been consulted Patient will be n.p.o. as patient has been unable to tolerate any p.o. intake over the past 5 days Status post LR 1 L bolus per EDP On admission I ordered sodium chloride 125 mL/h, 1 day ordered  Diabetes mellitus type 2, noninsulin dependent (HCC) Home metformin will not be resumed on admission Insulin SSI with at bedtime coverage ordered, renal/thin dosing  Hyperlipidemia Simvastatin 40 mg daily not resumed on admission  Depression Home sertraline 100 mg p.o. daily not resumed on admission AM team to resume when the benefits outweigh the risk  Essential hypertension Hydrochlorothiazide 25 mg daily, benazepril 10 mg daily, amlodipine 5 mg daily, were not resumed on admission AM team to resume when the benefits outweigh the risk Hydralazine 5 mg IV every 6 hours as needed for SBP  greater 165, 5 days ordered  Chart reviewed.   Pending med reconciliation however patient states that  he has not been taking any of his medication except for temazepam since Thursday.  AM team to resume home medications when patient able to tolerate.  DVT prophylaxis: Heparin 5000 units subcutaneous one-time dose ordered on admission Code Status: Full code Diet: N.p.o. Family Communication: Updated to spouse at bedside with patient's permission Disposition Plan: Pending clinical course Consults called: Gastroenterology service Admission status: MedSurg, inpatient  Past Medical History:  Diagnosis Date   E. coli infection    History of appendectomy    Past Surgical History:  Procedure Laterality Date   cardiac catherization     GASTRIC BYPASS     upper blepharoplasty     Social History:  reports that he quit smoking about 56 years ago. His smoking use included cigarettes. He has never used smokeless tobacco. He reports that he does not currently use alcohol. He reports that he does not currently use drugs.  Allergies  Allergen Reactions   Morphine And Codeine    Family History  Problem Relation Age of Onset   Stroke Mother    Heart disease Father    Heart attack Father    Heart attack Brother    Heart disease Brother    Family history: Family history reviewed and not pertinent.  Prior to Admission medications   Medication Sig Start Date End Date Taking? Authorizing Provider  amLODipine (NORVASC) 5 MG tablet Take 5 mg by mouth daily.    [provider]  amoxicillin-clavulanate (AUGMENTIN) 875-125 MG tablet Take 1 tablet by mouth 2 (two) times daily.    [provider]  Ascorbic Acid (VITAMIN C ADULT GUMMIES PO) Take by mouth.    [provider]  aspirin 81 MG chewable tablet Chew by mouth daily.    [provider]  benazepril (LOTENSIN) 10 MG tablet Take 10 mg by mouth daily.    [provider]  buPROPion (WELLBUTRIN SR) 150 MG 12 hr tablet Take 150 mg by mouth 2 (two) times daily.    [provider]  Cyanocobalamin  (B-12 COMPLIANCE INJECTION IJ) Inject as directed.    [provider]  hydrochlorothiazide (HYDRODIURIL) 25 MG tablet Take 25 mg by mouth daily.    [provider]  ibuprofen (ADVIL,MOTRIN) 200 MG tablet Take 200 mg by mouth every 6 (six) hours as needed.    [provider]  metFORMIN (GLUCOPHAGE) 500 MG tablet Take by mouth 2 (two) times daily with a meal.    [provider]  Multiple Vitamin (MULTIVITAMIN) tablet Take 1 tablet by mouth daily.    [provider]  multivitamin-lutein (OCUVITE-LUTEIN) CAPS capsule Take 1 capsule by mouth daily.    [provider]  nitrofurantoin, macrocrystal-monohydrate, (MACROBID) 100 MG capsule Take 100 mg by mouth 2 (two) times daily.    [provider]  omeprazole (PRILOSEC) 20 MG capsule Take 20 mg by mouth daily.    [provider]  pyridoxine (B-6) 100 MG tablet Take 100 mg by mouth daily.    [provider]  sertraline (ZOLOFT) 100 MG tablet Take 100 mg by mouth daily.    [provider]  simvastatin (ZOCOR) 40 MG tablet Take 40 mg by mouth daily.    [provider]  tamsulosin (FLOMAX) 0.4 MG CAPS capsule Take 0.4 mg by mouth.    [provider]  temazepam (RESTORIL) 30 MG capsule Take 30 mg by mouth  at bedtime as needed for sleep.    [provider]   Physical Exam: Vitals:   09/11/23 1740 09/11/23 1900 09/11/23 1930 09/11/23 1932  BP:  (!) 140/81 (!) 142/80   Pulse:  78 74   Resp:      Temp: 98.5 F (36.9 C)   99.8 F (37.7 C)  TempSrc: Oral   Oral  SpO2:  95% 98%   Weight:      Height:       Constitutional: appears age-appropriate, NAD, calm Eyes: PERRL, lids and conjunctivae normal ENMT: Mucous membranes are moist. Posterior pharynx clear of any exudate or lesions. Age-appropriate dentition. Hearing appropriate Neck: normal, supple, no masses, no thyromegaly Respiratory: clear to auscultation bilaterally, no wheezing, no  crackles. Normal respiratory effort. No accessory muscle use.  Cardiovascular: Regular rate and rhythm, no murmurs / rubs / gallops. No extremity edema. 2+ pedal pulses. No carotid bruits.  Abdomen: no tenderness, no masses palpated, no hepatosplenomegaly. Bowel sounds positive.  Musculoskeletal: no clubbing / cyanosis. No joint deformity upper and lower extremities. Good ROM, no contractures, no atrophy. Normal muscle tone.  Skin: no rashes, lesions, ulcers. No induration Neurologic: Sensation intact. Strength 5/5 in all 4.  Psychiatric: Normal judgment and insight. Alert and oriented x 3. Normal mood.   EKG: independently reviewed, showing sinus rhythm with rate of 86, QTc 454  Chest x-ray on Admission: not indicated at this time  CT ABDOMEN PELVIS W CONTRAST Result Date: 09/11/2023 CLINICAL DATA:  Abdominal pain, acute, nonlocalized. complaints of epigastric pain that started last Thursday. Pt also states that they've lost their appetite a few weeks ago. EXAM: CT ABDOMEN AND PELVIS WITH CONTRAST TECHNIQUE: Multidetector CT imaging of the abdomen and pelvis was performed using the standard protocol following bolus administration of intravenous contrast. RADIATION DOSE REDUCTION: This exam was performed according to the departmental dose-optimization program which includes automated exposure control, adjustment of the mA and/or kV according to patient size and/or use of iterative reconstruction technique. CONTRAST:  OMNIPAQUE IOHEXOL 300 MG/ML  SOLN COMPARISON:  CT abdomen pelvis 03/08/2016 FINDINGS: Lower chest: At least small volume pericardial effusion. Trace left pleural effusion. At least small volume hiatal hernia. Four-vessel coronary calcification. Partially visualized cardiac leads. Hepatobiliary: No focal liver abnormality. Calcified gallstone noted within the gallbladder lumen. No gallbladder wall thickening or pericholecystic fluid. No biliary dilatation. Pancreas: Diffusely atrophic.  No focal lesion. Otherwise normal pancreatic contour. No surrounding inflammatory changes. No main pancreatic ductal dilatation. Spleen: Normal in size without focal abnormality. Adrenals/Urinary Tract: No adrenal nodule bilaterally. Bilateral kidneys enhance symmetrically. No hydronephrosis. No hydroureter. Bilateral nephrolithiasis measuring 4 mm on the right and 4 mm on the left. Fluid dense lesion of the left kidney likely represent simple renal cysts. Simple renal cysts, in the absence of clinically indicated signs/symptoms, require no independent follow-up. The urinary bladder is unremarkable. Stomach/Bowel: Stomach is within normal limits. No evidence of bowel wall thickening or dilatation. The appendix is not definitely identified with no inflammatory changes in the right lower quadrant to suggest acute appendicitis. Vascular/Lymphatic: No abdominal aorta or iliac aneurysm. Severe atherosclerotic plaque of the aorta and its branches. No abdominal, pelvic, or inguinal lymphadenopathy. Reproductive: The prostate is enlarged measuring up to 5.6 cm. Other: No intraperitoneal free fluid. No intraperitoneal free gas. No organized fluid collection. Musculoskeletal: No abdominal wall hernia or abnormality. No suspicious lytic or blastic osseous lesions. No acute displaced fracture. Multilevel severe degenerative changes of the spine. Mild retrolisthesis of L1 on  L2, L2 on L3, L3 on L4. IMPRESSION: 1. At least small volume pericardial effusion. 2. Trace left pleural effusion. 3. At least small volume hiatal hernia in a patient status post Roux-en-Y gastric bypass. 4. Nonobstructive bilateral nephrolithiasis measuring up to 4 mm. 5. Cholelithiasis no acute cholecystitis. 6. Aortic Atherosclerosis (ICD10-I70.0) including four-vessel coronary calcification. Electronically Signed   By: Morgane  Naveau M.D.   On: 09/11/2023 17:48   Labs on Admission: I have personally reviewed following labs  CBC: Recent Labs  Lab  09/11/23 1508  WBC 8.9  HGB 15.2  HCT 45.8  MCV 102.7*  PLT 183   Basic Metabolic Panel: Recent Labs  Lab 09/11/23 1508  NA 139  K 4.2  CL 103  CO2 27  GLUCOSE 105*  BUN 27*  CREATININE 0.96  CALCIUM 9.3   GFR: Estimated Creatinine Clearance: 77.4 mL/min (by C-G formula based on SCr of 0.96 mg/dL).  Liver Function Tests: Recent Labs  Lab 09/11/23 1508  AST 30  ALT 21  ALKPHOS 77  BILITOT 1.2  PROT 5.8*  ALBUMIN 2.7*   Recent Labs  Lab 09/11/23 1508  LIPASE 24   This document was prepared using Dragon Voice Recognition software and may include unintentional dictation errors.  Dr. Reinhold Carbine [facility]  If 7PM-7AM, please contact overnight-coverage provider If 7AM-7PM, please contact day attending provider [email or web address]  09/11/2023, 8:01 PM

## 2023-09-11 NOTE — Assessment & Plan Note (Signed)
 Home metformin will not be resumed on admission Insulin SSI with at bedtime coverage ordered, renal/thin dosing

## 2023-09-11 NOTE — Assessment & Plan Note (Signed)
 Suspect esophageal dysmotility Gastroenterology service has been consulted Patient will be n.p.o. as patient has been unable to tolerate any p.o. intake over the past 5 days Status post LR 1 L bolus per EDP On admission I ordered sodium chloride 125 mL/h, 1 day ordered

## 2023-09-12 ENCOUNTER — Encounter
Admission: EM | Disposition: A | Discharge status: Home / Self Care | Payer: Self-pay | Source: Home / Self Care | Attending: Family Medicine

## 2023-09-12 ENCOUNTER — Inpatient Hospital Stay: Admitting: Anesthesiology

## 2023-09-12 DIAGNOSIS — K449 Diaphragmatic hernia without obstruction or gangrene: Secondary | ICD-10-CM

## 2023-09-12 DIAGNOSIS — K9289 Other specified diseases of the digestive system: Secondary | ICD-10-CM | POA: Diagnosis not present

## 2023-09-12 DIAGNOSIS — K9189 Other postprocedural complications and disorders of digestive system: Secondary | ICD-10-CM | POA: Diagnosis not present

## 2023-09-12 DIAGNOSIS — K289 Gastrojejunal ulcer, unspecified as acute or chronic, without hemorrhage or perforation: Secondary | ICD-10-CM

## 2023-09-12 DIAGNOSIS — K279 Peptic ulcer, site unspecified, unspecified as acute or chronic, without hemorrhage or perforation: Secondary | ICD-10-CM

## 2023-09-12 DIAGNOSIS — K222 Esophageal obstruction: Secondary | ICD-10-CM

## 2023-09-12 DIAGNOSIS — F32A Depression, unspecified: Secondary | ICD-10-CM

## 2023-09-12 DIAGNOSIS — R1310 Dysphagia, unspecified: Secondary | ICD-10-CM | POA: Diagnosis not present

## 2023-09-12 HISTORY — PX: ESOPHAGOGASTRODUODENOSCOPY: SHX5428

## 2023-09-12 LAB — BASIC METABOLIC PANEL WITH GFR
Anion gap: 11 (ref 5–15)
BUN: 26 mg/dL — ABNORMAL HIGH (ref 8–23)
CO2: 24 mmol/L (ref 22–32)
Calcium: 9 mg/dL (ref 8.9–10.3)
Chloride: 106 mmol/L (ref 98–111)
Creatinine, Ser: 0.75 mg/dL (ref 0.61–1.24)
GFR, Estimated: >60 mL/min (ref >60)
Glucose, Bld: 83 mg/dL (ref 70–99)
Potassium: 3.2 mmol/L — ABNORMAL LOW (ref 3.5–5.1)
Sodium: 141 mmol/L (ref 135–145)

## 2023-09-12 LAB — GLUCOSE, CAPILLARY
Glucose-Capillary: 82 mg/dL (ref 70–99)
Glucose-Capillary: 91 mg/dL (ref 70–99)
Glucose-Capillary: 76 mg/dL (ref 70–99)
Glucose-Capillary: 98 mg/dL (ref 70–99)

## 2023-09-12 LAB — CBC
HCT: 44.7 % (ref 39.0–52.0)
Hemoglobin: 14.8 g/dL (ref 13.0–17.0)
MCH: 33.5 pg (ref 26.0–34.0)
MCHC: 33.1 g/dL (ref 30.0–36.0)
MCV: 101.1 fL — ABNORMAL HIGH (ref 80.0–100.0)
Platelets: 167 10*3/uL (ref 150–400)
RBC: 4.42 MIL/uL (ref 4.22–5.81)
RDW: 13.4 % (ref 11.5–15.5)
WBC: 7.3 10*3/uL (ref 4.0–10.5)
nRBC: 0 % (ref 0.0–0.2)

## 2023-09-12 SURGERY — EGD (ESOPHAGOGASTRODUODENOSCOPY)
Anesthesia: General

## 2023-09-12 MED ORDER — SERTRALINE HCL 50 MG PO TABS
100.0000 mg | ORAL_TABLET | Freq: Every day | ORAL | Status: DC
  Administered 2023-09-12 – 2023-09-13 (×2): 100 mg via ORAL
  Filled 2023-09-12 (×2): qty 2

## 2023-09-12 MED ORDER — POTASSIUM CHLORIDE 10 MEQ/100ML IV SOLN
10.0000 meq | INTRAVENOUS | Status: AC
  Administered 2023-09-12 (×3): 10 meq via INTRAVENOUS
  Filled 2023-09-12 (×3): qty 100

## 2023-09-12 MED ORDER — LIDOCAINE HCL (PF) 2 % IJ SOLN
INTRAMUSCULAR | Status: AC
  Filled 2023-09-12: qty 5

## 2023-09-12 MED ORDER — PANTOPRAZOLE SODIUM 40 MG PO TBEC
40.0000 mg | DELAYED_RELEASE_TABLET | Freq: Every day | ORAL | Status: DC
  Administered 2023-09-12 – 2023-09-13 (×2): 40 mg via ORAL
  Filled 2023-09-12 (×2): qty 1

## 2023-09-12 MED ORDER — PROPOFOL 10 MG/ML IV BOLUS
INTRAVENOUS | Status: AC
  Filled 2023-09-12: qty 20

## 2023-09-12 MED ORDER — SIMVASTATIN 20 MG PO TABS: 40.0000 mg | ORAL_TABLET | Freq: Every day | ORAL | Status: DC

## 2023-09-12 MED ORDER — PROPOFOL 500 MG/50ML IV EMUL
INTRAVENOUS | Status: DC | PRN
  Administered 2023-09-12: 75 ug/kg/min via INTRAVENOUS

## 2023-09-12 MED ORDER — ENSURE PLUS HIGH PROTEIN PO LIQD
237.0000 mL | Freq: Two times a day (BID) | ORAL | Status: DC
  Administered 2023-09-13: 237 mL via ORAL

## 2023-09-12 MED ORDER — PROPOFOL 10 MG/ML IV BOLUS
INTRAVENOUS | Status: DC | PRN
  Administered 2023-09-12 (×2): 50 mg via INTRAVENOUS

## 2023-09-12 MED ORDER — ATORVASTATIN CALCIUM 20 MG PO TABS
40.0000 mg | ORAL_TABLET | Freq: Every day | ORAL | Status: DC
  Administered 2023-09-12: 40 mg via ORAL
  Filled 2023-09-12: qty 2

## 2023-09-12 MED ORDER — RIVAROXABAN 20 MG PO TABS
20.0000 mg | ORAL_TABLET | Freq: Every day | ORAL | Status: DC
  Administered 2023-09-12: 20 mg via ORAL
  Filled 2023-09-12 (×2): qty 1

## 2023-09-12 MED ORDER — BUPROPION HCL ER (XL) 150 MG PO TB24
150.0000 mg | ORAL_TABLET | Freq: Every day | ORAL | Status: DC
  Administered 2023-09-12 – 2023-09-13 (×2): 150 mg via ORAL
  Filled 2023-09-12 (×2): qty 1

## 2023-09-12 MED ORDER — POTASSIUM CHLORIDE 10 MEQ/100ML IV SOLN
10.0000 meq | INTRAVENOUS | Status: AC
  Administered 2023-09-12: 10 meq via INTRAVENOUS
  Filled 2023-09-12: qty 100

## 2023-09-12 MED ORDER — LIDOCAINE HCL (CARDIAC) PF 100 MG/5ML IV SOSY
PREFILLED_SYRINGE | INTRAVENOUS | Status: DC | PRN
  Administered 2023-09-12: 100 mg via INTRAVENOUS

## 2023-09-12 NOTE — Plan of Care (Signed)

## 2023-09-12 NOTE — Consult Note (Signed)
 Marnee Sink, MD [facility]  [address] [place], [place] [address] Phone: [phone] Fax : [phone]  Consultation  Referring Provider:     Dr. Reinhold Carbine Primary Care Physician:  Sari Cunning, MD Primary Gastroenterologist: [facility]         Reason for Consultation:     Dysphagia  Date of Admission:  09/11/2023 Date of Consultation:  09/12/2023          Preconsult chart review  HPI:   Nathan [name] is a 73 y.o. male with a history of a Roux-en-Y with an upper endoscopy at [facility] in 2021 showing:  Impression: - Non-obstructing Schatzki ring. - A single gastric polyp. Resected and retrieved.  Clips (MR conditional) were placed. - Scar in the gastric body. - Roux-en-Y gastrojejunostomy with gastrojejunal  anastomosis characterized by healthy appearing mucosa. - Normal examined jejunum. Recommendation: - Discharge patient to home (via wheelchair). - Await pathology results. - Repeat upper endoscopy in 6-12 months for  surveillance if pathology confirms hyperplastic polyp. - The findings and recommendations were discussed with  the patient and their family.   The patient now presents with he has been unable to tolerate p.o. intake including pills and food since 6 days ago.  The patient was seen by his PCP yesterday and reported a possible esophageal stricture versus peptic ulcer disease with a 25 pound weight loss and was told to go to the emergency room.  The patient had a CT scan of the abdomen that showed:  IMPRESSION: 1. At least small volume pericardial effusion. 2. Trace left pleural effusion. 3. At least small volume hiatal hernia in a patient status post Roux-en-Y gastric bypass. 4. Nonobstructive bilateral nephrolithiasis measuring up to 4 mm. 5. Cholelithiasis no acute cholecystitis. 6. Aortic Atherosclerosis (ICD10-I70.0) including four-vessel coronary calcification.  The patient had also reported epigastric discomfort  that started at the same time.  He reported that he was unable to tolerate his heart medications but was able to take his sleeping medication when he dissolved with milk. The patient states he is able to have cream potatoes but is not able to tolerate much more than that.  The patient does report that he takes Advil twice a week.  Past Medical History:  Diagnosis Date   E. coli infection    History of appendectomy     Past Surgical History:  Procedure Laterality Date   cardiac catherization     GASTRIC BYPASS     upper blepharoplasty      Prior to Admission medications   Medication Sig Start Date End Date Taking? Authorizing Provider  atorvastatin (LIPITOR) 40 MG tablet Take 40 mg by mouth daily.   Yes [provider]  dapagliflozin propanediol (FARXIGA) 10 MG TABS tablet Take 10 mg by mouth daily.   Yes [provider]  dofetilide (TIKOSYN) 250 MCG capsule Take 250 mcg by mouth 2 (two) times daily.   Yes [provider]  metoprolol succinate (TOPROL-XL) 25 MG 24 hr tablet Take 25 mg by mouth daily.   Yes [provider]  rivaroxaban (XARELTO) 20 MG TABS tablet Take 20 mg by mouth daily with supper.   Yes [provider]  sacubitril-valsartan (ENTRESTO) 97-103 MG Take 1 tablet by mouth 2 (two) times daily.   Yes [provider]  Semaglutide,0.25 or 0.5MG /DOS, 2 MG/3ML SOPN Inject 0.25 mg into the skin once a week.   Yes [provider]  spironolactone (ALDACTONE)  25 MG tablet Take 25 mg by mouth daily.   Yes [provider]  sucralfate (CARAFATE) 1 g tablet Take 1 g by mouth 4 (four) times daily -  with meals and at bedtime.   Yes [provider]  amoxicillin-clavulanate (AUGMENTIN) 875-125 MG tablet Take 1 tablet by mouth 2 (two) times daily.   Yes [provider]  Ascorbic Acid (VITAMIN C ADULT GUMMIES PO) Take by mouth.    [provider]  aspirin 81 MG chewable tablet Chew by mouth  daily.    [provider]  benazepril (LOTENSIN) 10 MG tablet Take 10 mg by mouth daily. Patient not taking: Reported on 09/11/2023    [provider]  buPROPion (WELLBUTRIN SR) 150 MG 12 hr tablet Take 150 mg by mouth 2 (two) times daily.   Yes [provider]  Cyanocobalamin (B-12 COMPLIANCE INJECTION IJ) Inject as directed. Patient not taking: Reported on 09/11/2023    [provider]  hydrochlorothiazide (HYDRODIURIL) 25 MG tablet Take 25 mg by mouth daily. Patient not taking: Reported on 09/11/2023    [provider]  metFORMIN (GLUCOPHAGE) 500 MG tablet Take by mouth 2 (two) times daily with a meal. Patient not taking: Reported on 09/11/2023    [provider]  Multiple Vitamin (MULTIVITAMIN) tablet Take 1 tablet by mouth daily.   Yes [provider]  multivitamin-lutein (OCUVITE-LUTEIN) CAPS capsule Take 1 capsule by mouth daily.   Yes [provider]  omeprazole (PRILOSEC) 20 MG capsule Take 20 mg by mouth daily.   Yes [provider]  pyridoxine (B-6) 100 MG tablet Take 100 mg by mouth daily. Patient not taking: Reported on 09/11/2023    [provider]  sertraline (ZOLOFT) 100 MG tablet Take 100 mg by mouth daily.   Yes [provider]  simvastatin (ZOCOR) 40 MG tablet Take 40 mg by mouth daily. Patient not taking: Reported on 09/11/2023    [provider]  tamsulosin (FLOMAX) 0.4 MG CAPS capsule Take 0.4 mg by mouth. Patient not taking: Reported on 09/11/2023    [provider]  temazepam (RESTORIL) 30 MG capsule Take 30 mg by mouth at bedtime as needed for sleep.   Yes [provider]    Family History  Problem Relation Age of Onset   Stroke Mother    Heart disease Father    Heart attack Father    Heart attack Brother    Heart disease Brother      Social History   Tobacco Use   Smoking status: Former    Current packs/day: 0.00    Types: Cigarettes     Quit date: 1969    Years since quitting: 56.4   Smokeless tobacco: Never  Substance Use Topics   Alcohol use: Not Currently   Drug use: Not Currently    Allergies as of 09/11/2023 - Review Complete 09/11/2023  Allergen Reaction Noted   Morphine and codeine  12/10/2017    Review of Systems:    All systems reviewed and negative except where noted in HPI.   Physical Exam:  Vital signs in last 24 hours: Temp:  [98.5 F (36.9 C)-99.8 F (37.7 C)] 99.1 F (37.3 C) (05/28 0344) Pulse Rate:  [64-87] 68 (05/28 0344) Resp:  [10-17] 17 (05/28 0344) BP: (95-142)/(63-89) 136/81 (05/28 0344) SpO2:  [90 %-99 %] 96 % (05/28 0344) Weight:  [93.4 kg] 93.4 kg (05/27 1213) Last BM Date : 09/05/23 General:   Pleasant, cooperative in NAD Head:  Normocephalic and atraumatic. Eyes:   No icterus.   Conjunctiva pink. PERRLA. Ears:  Normal auditory acuity. Neck:  Supple; no masses or thyroidomegaly Lungs: Respirations even and unlabored. Lungs clear to auscultation bilaterally.   No wheezes, crackles, or rhonchi.  Heart:  Regular rate and rhythm;  Without murmur, clicks, rubs or gallops Abdomen:  Soft, nondistended, nontender. Normal bowel sounds. No appreciable masses or hepatomegaly.  No rebound or guarding.  Rectal:  Not performed. Msk:  Symmetrical without gross deformities.    Extremities:  Without edema, cyanosis or clubbing. Neurologic:  Alert and oriented x3;  grossly normal neurologically. Skin:  Intact without significant lesions or rashes. Cervical Nodes:  No significant cervical adenopathy. Psych:  Alert and cooperative. Normal affect.  LAB RESULTS: Recent Labs    09/11/23 1508 09/12/23 0429  WBC 8.9 7.3  HGB 15.2 14.8  HCT 45.8 44.7  PLT 183 167   BMET Recent Labs    09/11/23 1508 09/12/23 0429  NA 139 141  K 4.2 3.2*  CL 103 106  CO2 27 24  GLUCOSE 105* 83  BUN 27* 26*  CREATININE 0.96 0.75  CALCIUM 9.3 9.0   LFT Recent Labs    09/11/23 1508  PROT 5.8*   ALBUMIN 2.7*  AST 30  ALT 21  ALKPHOS 77  BILITOT 1.2   PT/INR No results for input(s): "LABPROT", "INR" in the last 72 hours.  STUDIES: CT ABDOMEN PELVIS W CONTRAST Result Date: 09/11/2023 CLINICAL DATA:  Abdominal pain, acute, nonlocalized. complaints of epigastric pain that started last Thursday. Pt also states that they've lost their appetite a few weeks ago. EXAM: CT ABDOMEN AND PELVIS WITH CONTRAST TECHNIQUE: Multidetector CT imaging of the abdomen and pelvis was performed using the standard protocol following bolus administration of intravenous contrast. RADIATION DOSE REDUCTION: This exam was performed according to the departmental dose-optimization program which includes automated exposure control, adjustment of the mA and/or kV according to patient size and/or use of iterative reconstruction technique. CONTRAST:  OMNIPAQUE IOHEXOL 300 MG/ML  SOLN COMPARISON:  CT abdomen pelvis 03/08/2016 FINDINGS: Lower chest: At least small volume pericardial effusion. Trace left pleural effusion. At least small volume hiatal hernia. Four-vessel coronary calcification. Partially visualized cardiac leads. Hepatobiliary: No focal liver abnormality. Calcified gallstone noted within the gallbladder lumen. No gallbladder wall thickening or pericholecystic fluid. No biliary dilatation. Pancreas: Diffusely atrophic. No focal lesion. Otherwise normal pancreatic contour. No surrounding inflammatory changes. No main pancreatic ductal dilatation. Spleen: Normal in size without focal abnormality. Adrenals/Urinary Tract: No adrenal nodule bilaterally. Bilateral kidneys enhance symmetrically. No hydronephrosis. No hydroureter. Bilateral nephrolithiasis measuring 4 mm on the right and 4 mm on the left. Fluid dense lesion of the left kidney likely represent simple renal cysts. Simple renal cysts, in the absence of clinically indicated signs/symptoms, require no independent follow-up. The urinary bladder is  unremarkable. Stomach/Bowel: Stomach is within normal limits. No evidence of bowel wall thickening or dilatation. The appendix is not definitely identified with no inflammatory changes in the right lower quadrant to suggest acute appendicitis. Vascular/Lymphatic: No abdominal aorta or iliac aneurysm. Severe atherosclerotic plaque of the aorta and its branches. No abdominal, pelvic, or inguinal lymphadenopathy. Reproductive: The prostate is enlarged measuring up to 5.6 cm. Other: No intraperitoneal free fluid. No intraperitoneal free gas. No organized fluid collection. Musculoskeletal: No abdominal wall hernia or abnormality. No suspicious lytic or blastic osseous lesions. No acute displaced fracture. Multilevel severe degenerative changes of the spine. Mild retrolisthesis of L1 on L2, L2  on L3, L3 on L4. IMPRESSION: 1. At least small volume pericardial effusion. 2. Trace left pleural effusion. 3. At least small volume hiatal hernia in a patient status post Roux-en-Y gastric bypass. 4. Nonobstructive bilateral nephrolithiasis measuring up to 4 mm. 5. Cholelithiasis no acute cholecystitis. 6. Aortic Atherosclerosis (ICD10-I70.0) including four-vessel coronary calcification. Electronically Signed   By: Morgane  Naveau M.D.   On: 09/11/2023 17:48      Impression / Plan:   Assessment: Principal Problem:   Gastrointestinal dysmotility Active Problems:   Essential hypertension   Depression   Hyperlipidemia   GERD (gastroesophageal reflux disease)   Insomnia   Diabetes mellitus type 2, noninsulin dependent (HCC)   Nathan [name] is a 73 y.o. y/o male with dysphagia and inability to take his medications.  Plan:  The patient will be set up for an upper endoscopy.  The patient has been n.p.o. so the endoscopy will be done today.  The patient has been told to avoid NSAIDs since he has a Roux-en-Y gastrectomy and is at increased risk of peptic ulcer disease.  If the patient should have any endoscopic  findings to explain his dysphagia then it would be treated at the time of the procedure.  The patient has been explained the plan and agrees with it.  Thank you for involving me in the care of this patient.      LOS: 1 day   Marnee Sink, MD, MD. Sylvan Evener 09/12/2023, 7:10 AM,  Pager [phone] 7am-5pm  Check AMION for 5pm -7am coverage and on weekends   Note: This dictation was prepared with Dragon dictation along with smaller phrase technology. Any transcriptional errors that result from this process are unintentional.

## 2023-09-12 NOTE — Op Note (Signed)
 [facility] Gastroenterology Patient Name: Nathan [name] Procedure Date: 09/12/2023 1:19 PM MRN: [id number] Account #: [id number] Date of Birth: 1950/07/24 Admit Type: Outpatient Age: 73 Room: [facility] ENDO ROOM 3 Gender: Male Note Status: Finalized Instrument Name: Upper Endoscope [id number] Procedure:             Upper GI endoscopy Indications:           Dysphagia Providers:             Marnee Sink MD, MD Medicines:             Propofol per Anesthesia Complications:         No immediate complications. Procedure:             Pre-Anesthesia Assessment:                        - Prior to the procedure, a History and Physical was                         performed, and patient medications and allergies were                         reviewed. The patient's tolerance of previous                         anesthesia was also reviewed. The risks and benefits                         of the procedure and the sedation options and risks                         were discussed with the patient. All questions were                         answered, and informed consent was obtained. Prior                         Anticoagulants: The patient has taken no anticoagulant                         or antiplatelet agents. ASA Grade Assessment: II - A                         patient with mild systemic disease. After reviewing                         the risks and benefits, the patient was deemed in                         satisfactory condition to undergo the procedure.                        After obtaining informed consent, the endoscope was                         passed under direct vision. Throughout the procedure,                         the patient's blood pressure,  pulse, and oxygen                         saturations were monitored continuously. The                         Endosonoscope was introduced through the mouth, and                         advanced to the jejunum. The upper GI  endoscopy was                         accomplished without difficulty. The patient tolerated                         the procedure well. Findings:      A small hiatal hernia was present.      One benign-appearing, intrinsic mild stenosis was found at the       gastroesophageal junction. The stenosis was traversed. A TTS dilator was       passed through the scope. Dilation with a 15-16.5-18 mm balloon dilator       was performed to 18 mm. The dilation site was examined following       endoscope reinsertion and showed complete resolution of luminal       narrowing.      Evidence of a Roux-en-Y gastrojejunostomy was found. The gastrojejunal       anastomosis was characterized by erythema and ulceration. This was       traversed. The pouch-to-jejunum limb was characterized by ulceration.      Many non-bleeding superficial ulcers with pigmented material were found       in the jejunum. Impression:            - Small hiatal hernia.                        - Benign-appearing esophageal stenosis. Dilated.                        - Roux-en-Y gastrojejunostomy with gastrojejunal                         anastomosis characterized by erythema and ulceration.                        - Non-bleeding jejunal ulcers with pigmented material.                        - No specimens collected. Recommendation:        - Return patient to hospital ward for ongoing care.                        - Mechanical soft diet.                        - Continue present medications.                        - Check gastrin levels Procedure Code(s):     --- Professional ---  [phone], Esophagogastroduodenoscopy, flexible,                         transoral; with transendoscopic balloon dilation of                         esophagus (less than 30 mm diameter) Diagnosis Code(s):     --- Professional ---                        R13.10, Dysphagia, unspecified                        K28.9, Gastrojejunal ulcer,  unspecified as acute or                         chronic, without hemorrhage or perforation                        K22.2, Esophageal obstruction CPT copyright 2022 American [employer] Association. All rights reserved. The codes documented in this report are preliminary and upon coder review may  be revised to meet current compliance requirements. Marnee Sink MD, MD 09/12/2023 1:57:38 PM This report has been signed electronically. Number of Addenda: 0 Note Initiated On: 09/12/2023 1:19 PM Estimated Blood Loss:  Estimated blood loss: none.      [facility]

## 2023-09-12 NOTE — Transfer of Care (Signed)
 Immediate Anesthesia Transfer of Care Note  Patient: Nathan [name]  Procedure(s) Performed: EGD (ESOPHAGOGASTRODUODENOSCOPY)  Patient Location: PACU  Anesthesia Type:General  Level of Consciousness: sedated  Airway & Oxygen Therapy: Patient Spontanous Breathing  Post-op Assessment: Report given to RN and Post -op Vital signs reviewed and stable  Post vital signs: Reviewed and stable  Last Vitals:  Vitals Value Taken Time  BP 107/64 09/12/23 1358  Temp 37 C 09/12/23 1358  Pulse 67 09/12/23 1401  Resp 19 09/12/23 1402  SpO2 99 % 09/12/23 1401  Vitals shown include unfiled device data.  Last Pain:  Vitals:   09/12/23 1358  TempSrc: Temporal  PainSc: Asleep         Complications: No notable events documented.

## 2023-09-12 NOTE — Progress Notes (Addendum)
 PROGRESS NOTE    Nathan [name]  ZOX:[id number] DOB: 1950-09-10 DOA: 09/11/2023 PCP: Sari Cunning, MD  Chief Complaint  Patient presents with   Abdominal Pain    Hospital Course:  Nathan [name] is a 73 year old male with hypertension, hyperlipidemia, non-insulin-dependent diabetes, atrial fibrillation, history of Roux-en-Y, who presents to the emergency department with epigastric fullness and anorexia.  Patient reports over the last few weeks he has had difficulty tolerating p.o.  He reports he immediately vomits whenever he eats and thus he is no appetite.  He suspects a 35 pound weight loss in the last 1 month.  Vital signs and labs in the ED were grossly unremarkable.  Patient was started on Protonix and admitted for gastroenterology consultation.  Subjective: Patient was seen prior to endoscopy this morning.  He reports no acute pain at this time.  Does endorse some epigastric discomfort with deep inspiration.   Objective: Vitals:   09/12/23 0754 09/12/23 1311 09/12/23 1358 09/12/23 1428  BP: 133/80 (!) 145/80 107/64 138/66  Pulse: 66 (!) 48    Resp: 18 20    Temp: 98.7 F (37.1 C) (!) 97 F (36.1 C) 98.6 F (37 C)   TempSrc:  Temporal Temporal   SpO2: 98% 97%    Weight:      Height:        Intake/Output Summary (Last 24 hours) at 09/12/2023 1452 Last data filed at 09/12/2023 1400 Gross per 24 hour  Intake 2512.04 ml  Output 350 ml  Net 2162.04 ml   Filed Weights   09/11/23 1213  Weight: 93.4 kg    Examination: General exam: Appears calm and comfortable, NAD  Respiratory system: No work of breathing, symmetric chest wall expansion Cardiovascular system: S1 & S2 heard, RRR.  Gastrointestinal system: Abdomen is nondistended, soft and nontender.  Neuro: Alert and oriented. No focal neurological deficits. Extremities: Symmetric, expected ROM Skin: No rashes, lesions Psychiatry: Demonstrates appropriate judgement and insight. Mood & affect appropriate for situation.    Assessment & Plan:  Principal Problem:   Gastrointestinal dysmotility Active Problems:   Essential hypertension   Depression   Hyperlipidemia   GERD (gastroesophageal reflux disease)   Insomnia   Diabetes mellitus type 2, noninsulin dependent (HCC)   Dysphagia   Peptic ulcer disease   Jejunal ulcers - Multiple jejunal ulcers, nonbleeding with pigmented material seen on EGD today. - Gastrin levels ordered - Proceed with soft diet for now - Does not appear to be dehydrated on exam.  Will discontinue further IV fluids in light of heart failure - Encourage p.o. intake - Hemoglobin stable, trend.  Esophageal stenosis - Status post dilation via EGD 5/28  Small hiatal hernia - Seen on EGD  Roux-en-Y gastrojejunostomy with gastrojejunal anastomosis - Evaluated with EGD today - Was previously on Ozempic as well but has discontinued this recently  Diabetes mellitus type 2, non-insulin-dependent - Hemoglobin A1c 5.2% - Hold metformin at this time - Continue with sliding scale insulin on thin dosing. - Follow CBGs closely and titrate as needed.  Poor p.o. intake, anticipate minimal insulin needs  Paroxysmal atrial fibrillation Biventricular pacemaker present - Initially treated with metoprolol, transitioned to Tikosyn after cardioversion in 2022 - Will resume home dose Tikosyn when potassium above 4.  Replacing today.  Repeat potassium and Mg in AM.  Last dose of Tikosyn was 5/22 - On Xarelto, continue - Follows outpatient with [facility] - Will resume home meds  Heart failure with reduced EF - Follows outpatient  with [facility].  EF 2024: 50%. - At home takes farxiga, metoprolol, Entresto, spironolactone, Lasix  CAD - resume home meds.  Hyperlipidemia - Restart statin today  Depression - Resume sertraline and Wellbutrin at home doses  Hypertension - At home on HCTZ, benazepril, amlodipine. - Presently blood pressures normal to low without medication.   Will monitor closely and resume as needed  Hypokalemia - Replacement via IV given significant gastric ulcers.  Small volume pericardial effusion - Incidentally seen on CT.  No hemodynamic changes.  Enlarged prostate - Seen on CT 5.6 cm - Will order PSA to better evaluate - Monitor for lower urinary tract symptoms.  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  Does not currently have consistent p.o. intake  Consultants:  Treatment Team:  Consulting Physician: Marnee Sink, MD  Procedures:  EGD, esophageal dilation 5/28  Antimicrobials:  Anti-infectives (From admission, onward)    None       Data Reviewed: I have personally reviewed following labs and imaging studies CBC: Recent Labs  Lab 09/11/23 1508 09/12/23 0429  WBC 8.9 7.3  HGB 15.2 14.8  HCT 45.8 44.7  MCV 102.7* 101.1*  PLT 183 167   Basic Metabolic Panel: Recent Labs  Lab 09/11/23 1508 09/12/23 0429  NA 139 141  K 4.2 3.2*  CL 103 106  CO2 27 24  GLUCOSE 105* 83  BUN 27* 26*  CREATININE 0.96 0.75  CALCIUM 9.3 9.0   GFR: Estimated Creatinine Clearance: 92.8 mL/min (by C-G formula based on SCr of 0.75 mg/dL). Liver Function Tests: Recent Labs  Lab 09/11/23 1508  AST 30  ALT 21  ALKPHOS 77  BILITOT 1.2  PROT 5.8*  ALBUMIN 2.7*   CBG: Recent Labs  Lab 09/11/23 2010 09/12/23 0812 09/12/23 1141  GLUCAP 86 82 91    No results found for this or any previous visit (from the past 240 hours).   Radiology Studies: CT ABDOMEN PELVIS W CONTRAST Result Date: 09/11/2023 CLINICAL DATA:  Abdominal pain, acute, nonlocalized. complaints of epigastric pain that started last Thursday. Pt also states that they've lost their appetite a few weeks ago. EXAM: CT ABDOMEN AND PELVIS WITH CONTRAST TECHNIQUE: Multidetector CT imaging of the abdomen and pelvis  was performed using the standard protocol following bolus administration of intravenous contrast. RADIATION DOSE REDUCTION: This exam was performed according to the departmental dose-optimization program which includes automated exposure control, adjustment of the mA and/or kV according to patient size and/or use of iterative reconstruction technique. CONTRAST:  OMNIPAQUE IOHEXOL 300 MG/ML  SOLN COMPARISON:  CT abdomen pelvis 03/08/2016 FINDINGS: Lower chest: At least small volume pericardial effusion. Trace left pleural effusion. At least small volume hiatal hernia. Four-vessel coronary calcification. Partially visualized cardiac leads. Hepatobiliary: No focal liver abnormality. Calcified gallstone noted within the gallbladder lumen. No gallbladder wall thickening or pericholecystic fluid. No biliary dilatation. Pancreas: Diffusely atrophic. No focal lesion. Otherwise normal pancreatic contour. No surrounding inflammatory changes. No main pancreatic ductal dilatation. Spleen: Normal in size without focal abnormality. Adrenals/Urinary Tract: No adrenal nodule bilaterally. Bilateral kidneys enhance symmetrically. No hydronephrosis. No hydroureter. Bilateral nephrolithiasis measuring 4 mm on the right and 4 mm on the left. Fluid dense lesion of the left kidney likely represent simple renal cysts. Simple renal cysts, in the absence of clinically indicated signs/symptoms, require no independent follow-up. The  urinary bladder is unremarkable. Stomach/Bowel: Stomach is within normal limits. No evidence of bowel wall thickening or dilatation. The appendix is not definitely identified with no inflammatory changes in the right lower quadrant to suggest acute appendicitis. Vascular/Lymphatic: No abdominal aorta or iliac aneurysm. Severe atherosclerotic plaque of the aorta and its branches. No abdominal, pelvic, or inguinal lymphadenopathy. Reproductive: The prostate is enlarged measuring up to 5.6 cm. Other: No  intraperitoneal free fluid. No intraperitoneal free gas. No organized fluid collection. Musculoskeletal: No abdominal wall hernia or abnormality. No suspicious lytic or blastic osseous lesions. No acute displaced fracture. Multilevel severe degenerative changes of the spine. Mild retrolisthesis of L1 on L2, L2 on L3, L3 on L4. IMPRESSION: 1. At least small volume pericardial effusion. 2. Trace left pleural effusion. 3. At least small volume hiatal hernia in a patient status post Roux-en-Y gastric bypass. 4. Nonobstructive bilateral nephrolithiasis measuring up to 4 mm. 5. Cholelithiasis no acute cholecystitis. 6. Aortic Atherosclerosis (ICD10-I70.0) including four-vessel coronary calcification. Electronically Signed   By: Morgane  Naveau M.D.   On: 09/11/2023 17:48    Scheduled Meds:  insulin aspart  0-5 Units Subcutaneous QHS   insulin aspart  0-9 Units Subcutaneous TID WC   Continuous Infusions:  sodium chloride 125 mL/hr at 09/12/23 1340   potassium chloride       LOS: 1 day  MDM: Patient is high risk for one or more organ failure.  They necessitate ongoing hospitalization for continued IV therapies and subsequent lab monitoring. Total time spent interpreting labs and vitals, reviewing the medical record, coordinating care amongst consultants and care team members, directly assessing and discussing care with the patient and/or family: 55 min  Koby Pickup, DO [facility]  To contact the attending physician between 7A-7P please use Epic Chat. To contact the covering physician during after hours 7P-7A, please review Amion.  09/12/2023, 2:52 PM   *This document has been created with the assistance of dictation software. Please excuse typographical errors. *

## 2023-09-12 NOTE — Anesthesia Preprocedure Evaluation (Addendum)
 Anesthesia Evaluation  Patient identified by MRN, date of birth, ID band Patient awake    Reviewed: Allergy & Precautions, H&P , NPO status , Patient's Chart, lab work & pertinent test results  Airway Mallampati: I  TM Distance: >3 FB Neck ROM: full    Dental no notable dental hx.    Pulmonary sleep apnea , former smoker Trace left pleural effusion.   Pulmonary exam normal        Cardiovascular hypertension, +CHF  Normal cardiovascular exam+ dysrhythmias Atrial Fibrillation + pacemaker   Biventricular cardiac pacemaker in situ  cMRI (2017): EF 66%, no WMA, nml rv function, biatrial enlargement, DGE demonstrates subendocardial hyperenhancement in the basal inferolateral wall c/w prior MI in the LCx 2 % scar size. Small pericardial effusion.  Echo (2020): SWT 1.2cm, PWT 1.7cm EF 50%, nml RV function,  Echo (2021): SWT 1.1cm, PWT 1.2CM, EF 50%, GLS -11.8%, elevated LA pressures with DD, nml Rv function, mod MR cMRI (2022): EF 48%, hypokinesia of the lateral wall. Nml rv function, biatrial enlargement. Mild MR. DGE demonstrates subendocardial hyperenhancement in the basal inferolateral wall s/w prior MI in the LCx 2% scar size. Moderate pericardial effusion. Stress w/o evidence of inducible ischemia.  Echo (12/2022): SWT 1.4cm PWT 1.4cm, EF 50%, IVC dilated with abn resp collapse. Mod MR/TR RVSP 64, apical sparing  PYP (01/2023): Grade 1 H/CL ratio 1.38 RHC/EMBx (02/2023): RA 10, PA 50/20, PCWP 29, CI 2.5, PVR 3.5, EMBx negative for amyloid  Device Interrogation (04/2023): AF burden 62% Atrial pacing 30.82%, total VP 80%     Neuro/Psych  PSYCHIATRIC DISORDERS  Depression     Neuromuscular disease (polyneuropathy) CVA (Stroke (CMS/HHS-HCC) 2006 post op gastric bypass)    GI/Hepatic Neg liver ROS,GERD  ,, Gastrointestinal dysmotility   Endo/Other  diabetes, Well Controlled, Type 2    Renal/GU negative Renal ROS  negative  genitourinary   Musculoskeletal   Abdominal Normal abdominal exam  (+)   Peds  Hematology negative hematology ROS (+)   Anesthesia Other Findings Denies symptoms of foreign body stuck in esophagus. Denies solid ingestion in many days. He does not have difficulty swallowing saliva currently.+  Past Medical History: No date: E. coli infection No date: History of appendectomy  Past Surgical History: No date: cardiac catherization No date: GASTRIC BYPASS No date: upper blepharoplasty  BMI    Body Mass Index: 30.42 kg/m      Reproductive/Obstetrics negative OB ROS                              Anesthesia Physical Anesthesia Plan  ASA: 3  Anesthesia Plan: General   Post-op Pain Management:    Induction: Intravenous  PONV Risk Score and Plan: Propofol  infusion and TIVA  Airway Management Planned: Natural Airway  Additional Equipment:   Intra-op Plan:   Post-operative Plan:   Informed Consent: I have reviewed the patients History and Physical, chart, labs and discussed the procedure including the risks, benefits and alternatives for the proposed anesthesia with the patient or authorized representative who has indicated his/her understanding and acceptance.     Dental Advisory Given  Plan Discussed with: CRNA and Surgeon  Anesthesia Plan Comments:          Anesthesia Quick Evaluation

## 2023-09-13 ENCOUNTER — Encounter: Payer: Self-pay | Admitting: Gastroenterology

## 2023-09-13 ENCOUNTER — Other Ambulatory Visit: Payer: Self-pay

## 2023-09-13 DIAGNOSIS — K9289 Other specified diseases of the digestive system: Secondary | ICD-10-CM | POA: Diagnosis not present

## 2023-09-13 DIAGNOSIS — K279 Peptic ulcer, site unspecified, unspecified as acute or chronic, without hemorrhage or perforation: Secondary | ICD-10-CM | POA: Diagnosis not present

## 2023-09-13 DIAGNOSIS — G4700 Insomnia, unspecified: Secondary | ICD-10-CM

## 2023-09-13 DIAGNOSIS — K289 Gastrojejunal ulcer, unspecified as acute or chronic, without hemorrhage or perforation: Secondary | ICD-10-CM | POA: Diagnosis not present

## 2023-09-13 DIAGNOSIS — E785 Hyperlipidemia, unspecified: Secondary | ICD-10-CM

## 2023-09-13 DIAGNOSIS — K9189 Other postprocedural complications and disorders of digestive system: Secondary | ICD-10-CM | POA: Diagnosis not present

## 2023-09-13 DIAGNOSIS — F32A Depression, unspecified: Secondary | ICD-10-CM | POA: Diagnosis not present

## 2023-09-13 DIAGNOSIS — K2100 Gastro-esophageal reflux disease with esophagitis, without bleeding: Secondary | ICD-10-CM

## 2023-09-13 LAB — CBC WITH DIFFERENTIAL/PLATELET
Abs Immature Granulocytes: 0.02 10*3/uL (ref 0.00–0.07)
Basophils Absolute: 0 10*3/uL (ref 0.0–0.1)
Basophils Relative: 0 %
Eosinophils Absolute: 0.2 10*3/uL (ref 0.0–0.5)
Eosinophils Relative: 2 %
HCT: 43.5 % (ref 39.0–52.0)
Hemoglobin: 14.4 g/dL (ref 13.0–17.0)
Immature Granulocytes: 0 %
Lymphocytes Relative: 15 %
Lymphs Abs: 1.1 10*3/uL (ref 0.7–4.0)
MCH: 33.6 pg (ref 26.0–34.0)
MCHC: 33.1 g/dL (ref 30.0–36.0)
MCV: 101.6 fL — ABNORMAL HIGH (ref 80.0–100.0)
Monocytes Absolute: 0.6 10*3/uL (ref 0.1–1.0)
Monocytes Relative: 9 %
Neutro Abs: 5.2 10*3/uL (ref 1.7–7.7)
Neutrophils Relative %: 74 %
Platelets: 171 10*3/uL (ref 150–400)
RBC: 4.28 MIL/uL (ref 4.22–5.81)
RDW: 13.2 % (ref 11.5–15.5)
WBC: 7 10*3/uL (ref 4.0–10.5)
nRBC: 0 % (ref 0.0–0.2)

## 2023-09-13 LAB — COMPREHENSIVE METABOLIC PANEL WITH GFR
ALT: 21 U/L (ref 0–44)
AST: 28 U/L (ref 15–41)
Albumin: 2.8 g/dL — ABNORMAL LOW (ref 3.5–5.0)
Alkaline Phosphatase: 79 U/L (ref 38–126)
Anion gap: 9 (ref 5–15)
BUN: 28 mg/dL — ABNORMAL HIGH (ref 8–23)
CO2: 24 mmol/L (ref 22–32)
Calcium: 9.2 mg/dL (ref 8.9–10.3)
Chloride: 107 mmol/L (ref 98–111)
Creatinine, Ser: 0.79 mg/dL (ref 0.61–1.24)
GFR, Estimated: >60 mL/min (ref >60)
Glucose, Bld: 99 mg/dL (ref 70–99)
Potassium: 3.6 mmol/L (ref 3.5–5.1)
Sodium: 140 mmol/L (ref 135–145)
Total Bilirubin: 0.8 mg/dL (ref 0.0–1.2)
Total Protein: 5.7 g/dL — ABNORMAL LOW (ref 6.5–8.1)

## 2023-09-13 LAB — MAGNESIUM: Magnesium: 2.1 mg/dL (ref 1.7–2.4)

## 2023-09-13 LAB — PSA: Prostatic Specific Antigen: 1.45 ng/mL (ref 0.00–4.00)

## 2023-09-13 LAB — GLUCOSE, CAPILLARY
Glucose-Capillary: 104 mg/dL — ABNORMAL HIGH (ref 70–99)
Glucose-Capillary: 102 mg/dL — ABNORMAL HIGH (ref 70–99)

## 2023-09-13 LAB — PHOSPHORUS: Phosphorus: 2.5 mg/dL (ref 2.5–4.6)

## 2023-09-13 MED ORDER — PANTOPRAZOLE SODIUM 40 MG PO TBEC
40.0000 mg | DELAYED_RELEASE_TABLET | Freq: Two times a day (BID) | ORAL | 0 refills | Status: AC
  Filled 2023-09-13: qty 60, 30d supply, fill #0

## 2023-09-13 MED ORDER — POTASSIUM CHLORIDE CRYS ER 20 MEQ PO TBCR: 40.0000 meq | EXTENDED_RELEASE_TABLET | Freq: Once | ORAL | Status: DC

## 2023-09-13 MED ORDER — DIPHENHYDRAMINE-ZINC ACETATE 2-0.1 % EX CREA
TOPICAL_CREAM | Freq: Two times a day (BID) | CUTANEOUS | Status: DC | PRN
  Filled 2023-09-13: qty 28

## 2023-09-13 NOTE — Care Management Important Message (Signed)
 Important Message  Patient Details  Name: Nathan [name] MRN: [id number] Date of Birth: [birth year]   Important Message Given:  Yes - Medicare IM     Anise Kerns 09/13/2023, 2:52 PM

## 2023-09-13 NOTE — TOC CM/SW Note (Signed)
 Transition of Care [facility]) - Inpatient Brief Assessment   Patient Details  Name: Nathan [name] MRN: [id number] Date of Birth: [birth year]  Transition of Care [facility]) CM/SW Contact:    Odilia Bennett, LCSW Phone Number: 09/13/2023, 10:47 AM   Clinical Narrative: CSW reviewed chart. No TOC needs identified at this time. CSW will continue to follow progress. Please place [facility] consult if any needs arise.  Transition of Care Asessment: Insurance and Status: Insurance coverage has been reviewed Patient has primary care physician: Yes Home environment has been reviewed: Single family home Prior level of function:: Not documented Prior/Current Home Services: No current home services Social Drivers of Health Review: SDOH reviewed no interventions necessary Readmission risk has been reviewed: Yes Transition of care needs: no transition of care needs at this time

## 2023-09-13 NOTE — Plan of Care (Signed)

## 2023-09-13 NOTE — Anesthesia Postprocedure Evaluation (Signed)
 Anesthesia Post Note  Patient: Nathan [name]  Procedure(s) Performed: EGD (ESOPHAGOGASTRODUODENOSCOPY)  Patient location during evaluation: PACU Anesthesia Type: General Level of consciousness: awake and alert Pain management: pain level controlled Vital Signs Assessment: post-procedure vital signs reviewed and stable Respiratory status: spontaneous breathing, nonlabored ventilation and respiratory function stable Cardiovascular status: blood pressure returned to baseline and stable Postop Assessment: no apparent nausea or vomiting Anesthetic complications: no   No notable events documented.   Last Vitals:  Vitals:   09/12/23 2111 09/13/23 0536  BP: 137/81 134/64  Pulse: 78 77  Resp: 16 18  Temp: 37.1 C 37.1 C  SpO2: 94% 97%    Last Pain:  Vitals:   09/13/23 0536  TempSrc: Oral  PainSc:                  Baltazar Bonier

## 2023-09-13 NOTE — Discharge Summary (Addendum)
 Physician Discharge Summary   Patient: Nathan [name] MRN: [id number] DOB: 1951-03-10  Admit date:     09/11/2023  Discharge date: 09/13/23  Discharge Physician: Roise Cleaver   PCP: Sari Cunning, MD   Recommendations at discharge:   Follow-up with cardiology to discuss when to resume Tikosyn and Entresto Follow-up with PCP for further medication titration for blood pressure control Follow-up with gastroenterology for continued monitoring of ulcers and gastrin level results  Discharge Diagnoses: Principal Problem:   Gastrointestinal dysmotility Active Problems:   Essential hypertension   Depression   Hyperlipidemia   GERD (gastroesophageal reflux disease)   Insomnia   Diabetes mellitus type 2, noninsulin dependent (HCC)   Dysphagia   Peptic ulcer disease  Resolved Problems:   * No resolved hospital problems. *  Hospital Course: Nathan ROTUNDO is a 73 year old male with hypertension, hyperlipidemia, non-insulin-dependent diabetes, atrial fibrillation, history of Roux-en-Y, who presents to the emergency department with epigastric fullness and anorexia.  Patient reports over the last few weeks he has had difficulty tolerating p.o.  He reports he immediately vomits whenever he eats and thus he is no appetite.  He suspects a 35 pound weight loss in the last 1 month.  Vital signs and labs in the ED were grossly unremarkable.  Patient was started on Protonix and admitted for gastroenterology consultation.  On 5/27 patient underwent EGD.  EGD revealed multiple jejunal ulcers, nonbleeding, with pigmented material.  It also revealed 1 small esophageal stricture which was dilated.  Patient was started on PPI and soft food diet which she tolerated well.  By 5/29 patient reported feeling 100% better and was ready to discharge home.  He was tolerating all food consistencies and all of his medications. A gastrin level was ordered to better evaluate etiology of ulcers but this has not yet resulted  at time of discharge.  Patient will need to follow-up closely with GI.  He will need to proceed with PPI twice daily, this was discussed with him and his wife at bedside.  Jejunal ulcers - Multiple jejunal ulcers, nonbleeding with pigmented material seen on EGD 5/28 - Gastrin levels ordered, pending - Tolerating diet - Hemoglobin stable - PPI twice daily - Follow-up outpatient with GI, referral placed   Esophageal stenosis - Status post dilation via EGD 5/28   Small hiatal hernia - Seen on EGD.  PPI as above   Roux-en-Y gastrojejunostomy with gastrojejunal anastomosis - Was previously on Ozempic as well but has discontinued this recently   Diabetes mellitus type 2, non-insulin-dependent - Hemoglobin A1c 5.2% - Patient reports he has discontinued metformin at home. - Continue diet control  Paroxysmal atrial fibrillation Biventricular pacemaker present - Initially treated with metoprolol, transitioned to Tikosyn after cardioversion in 2022 - Patient has been off Tikosyn for a full week.  Potassium consistently under 4 during this admission.  Has been replaced - Given he has now been off Tikosyn for a full week he will need to follow-up with cardiology to discuss reinitiation - Continue metoprolol at discharge for now - Continue Xarelto - Follows outpatient with [facility] cardiology   Heart failure with preserved EF - Follows outpatient with [facility] cardiology.  EF 2024: 50%. - At home takes farxiga, metoprolol, Entresto, spironolactone, Lasix.  Entresto and spironolactone held at discharge, follow-up with cardiology to discuss resuming   CAD - resume home meds.   Hyperlipidemia - Restart statin today   Depression - Resume sertraline and Wellbutrin at home doses   Hypertension -  Blood pressures here have been normal to low without medication - Resuming home meds gradually.  Will need close outpatient follow-up with PCP and cardiology to discuss further medication  titration  Hypokalemia - Replacement via IV given significant gastric ulcers.   Small volume pericardial effusion - Incidentally seen on CT.  No hemodynamic changes.   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 - PT/OT  BMI 31 Obesity Class 1 -- Outpatient follow up for lifestyle modification and risk factor management       Consultants: Gastroenterology Procedures performed: EGD 5/28 Disposition: Home Diet recommendation:  Discharge Diet Orders (From admission, onward)     Start     Ordered   09/13/23 0000  Diet general        09/13/23 1232           Carb modified diet DISCHARGE MEDICATION: Allergies as of 09/13/2023       Reactions   Morphine And Codeine         Medication List     PAUSE taking these medications    dofetilide 250 MCG capsule Wait to take this until your doctor or other care provider tells you to start again. Commonly known as: TIKOSYN Take 250 mcg by mouth 2 (two) times daily.   sacubitril-valsartan 97-103 MG Wait to take this until your doctor or other care provider tells you to start again. Commonly known as: ENTRESTO Take 1 tablet by mouth 2 (two) times daily.       STOP taking these medications    amoxicillin-clavulanate 875-125 MG tablet Commonly known as: AUGMENTIN   metFORMIN 500 MG tablet Commonly known as: GLUCOPHAGE   multivitamin tablet   omeprazole 20 MG capsule Commonly known as: PRILOSEC Replaced by: pantoprazole 40 MG tablet   simvastatin 40 MG tablet Commonly known as: ZOCOR   spironolactone 25 MG tablet Commonly known as: ALDACTONE   VITAMIN C ADULT GUMMIES PO       TAKE these medications    atorvastatin 40 MG tablet Commonly known as: LIPITOR Take 40 mg by mouth daily.   buPROPion 150 MG 24 hr tablet Commonly known as: WELLBUTRIN XL Take 150 mg by mouth daily.   dapagliflozin propanediol 10 MG Tabs  tablet Commonly known as: FARXIGA Take 10 mg by mouth daily.   metoprolol succinate 25 MG 24 hr tablet Commonly known as: TOPROL-XL Take 25 mg by mouth daily.   multivitamin-lutein Caps capsule Take 1 capsule by mouth daily.   pantoprazole 40 MG tablet Commonly known as: PROTONIX Take 1 tablet (40 mg total) by mouth 2 (two) times daily. Replaces: omeprazole 20 MG capsule   rivaroxaban 20 MG Tabs tablet Commonly known as: XARELTO Take 20 mg by mouth daily with supper.   sertraline 100 MG tablet Commonly known as: ZOLOFT Take 100 mg by mouth daily.   sucralfate 1 g tablet Commonly known as: CARAFATE Take 1 g by mouth 4 (four) times daily -  with meals and at bedtime.   temazepam 30 MG capsule Commonly known as: RESTORIL Take 30 mg by mouth at bedtime as needed for sleep.        Discharge Exam: Filed Weights   09/11/23 1213 09/13/23 0500  Weight: 93.4 kg 95.4 kg   Constitutional:  Normal appearance. Non toxic-appearing.  HENT: Head Normocephalic and atraumatic.  Mucous membranes are moist.  Eyes:  Extraocular intact. Conjunctivae normal.  Pupils are equal, round, and reactive to light.  Cardiovascular: Rate and Rhythm: Normal rate and regular rhythm.  Pulmonary: Non labored, symmetric rise of chest wall.  Musculoskeletal:  Normal range of motion.  Skin: warm and dry. not jaundiced.  Neurological: No focal deficit present. alert. Oriented. Psychiatric: Mood and Affect congruent.   Condition at discharge: stable  Discharge Instructions     Ambulatory referral to Gastroenterology   Complete by: As directed    [facility]   What is the reason for referral?: Other Comment - jejunal ulcers, hospital follow up   Call MD for:  difficulty breathing, headache or visual disturbances   Complete by: As directed    Call MD for:  persistant dizziness or light-headedness   Complete by: As directed    Call MD for:  persistant nausea and vomiting   Complete by: As  directed    Call MD for:  severe uncontrolled pain   Complete by: As directed    Call MD for:  temperature >100.4   Complete by: As directed    Diet general   Complete by: As directed    Discharge instructions   Complete by: As directed    While you were admitted we monitored your blood pressure.  We did not restart all of your blood pressure medications as your blood pressure and your pulse were too low to tolerate all of them.  Additionally, you have been off your Tikosyn for over a week.  This medication requires slow reinitiation and should be done under the guidance of your cardiologist in the clinic.  Please hold your Tikosyn until you are able to see your cardiologist and restart the medication under their guidance.  Return to the ER if you are feeling dizzy, lightheaded, or seeing bright red blood or black stools.   Increase activity slowly   Complete by: As directed          The results of significant diagnostics from this hospitalization (including imaging, microbiology, ancillary and laboratory) are listed below for reference.   Imaging Studies: CT ABDOMEN PELVIS W CONTRAST Result Date: 09/11/2023 CLINICAL DATA:  Abdominal pain, acute, nonlocalized. complaints of epigastric pain that started last Thursday. Pt also states that they've lost their appetite a few weeks ago. EXAM: CT ABDOMEN AND PELVIS WITH CONTRAST TECHNIQUE: Multidetector CT imaging of the abdomen and pelvis was performed using the standard protocol following bolus administration of intravenous contrast. RADIATION DOSE REDUCTION: This exam was performed according to the departmental dose-optimization program which includes automated exposure control, adjustment of the mA and/or kV according to patient size and/or use of iterative reconstruction technique. CONTRAST:  OMNIPAQUE  IOHEXOL  300 MG/ML  SOLN COMPARISON:  CT abdomen pelvis 03/08/2016 FINDINGS: Lower chest: At least small volume pericardial effusion.  Trace left pleural effusion. At least small volume hiatal hernia. Four-vessel coronary calcification. Partially visualized cardiac leads. Hepatobiliary: No focal liver abnormality. Calcified gallstone noted within the gallbladder lumen. No gallbladder wall thickening or pericholecystic fluid. No biliary dilatation. Pancreas: Diffusely atrophic. No focal lesion. Otherwise normal pancreatic contour. No surrounding inflammatory changes. No main pancreatic ductal dilatation. Spleen: Normal in size without focal abnormality. Adrenals/Urinary Tract: No adrenal nodule bilaterally. Bilateral kidneys enhance symmetrically. No hydronephrosis. No hydroureter. Bilateral nephrolithiasis measuring 4 mm on the right and 4 mm on the left. Fluid dense lesion of the left kidney likely represent simple renal cysts. Simple renal cysts, in the absence of clinically indicated signs/symptoms, require no independent follow-up. The urinary bladder  is unremarkable. Stomach/Bowel: Stomach is within normal limits. No evidence of bowel wall thickening or dilatation. The appendix is not definitely identified with no inflammatory changes in the right lower quadrant to suggest acute appendicitis. Vascular/Lymphatic: No abdominal aorta or iliac aneurysm. Severe atherosclerotic plaque of the aorta and its branches. No abdominal, pelvic, or inguinal lymphadenopathy. Reproductive: The prostate is enlarged measuring up to 5.6 cm. Other: No intraperitoneal free fluid. No intraperitoneal free gas. No organized fluid collection. Musculoskeletal: No abdominal wall hernia or abnormality. No suspicious lytic or blastic osseous lesions. No acute displaced fracture. Multilevel severe degenerative changes of the spine. Mild retrolisthesis of L1 on L2, L2 on L3, L3 on L4. IMPRESSION: 1. At least small volume pericardial effusion. 2. Trace left pleural effusion. 3. At least small volume hiatal hernia in a patient status post Roux-en-Y gastric bypass. 4.  Nonobstructive bilateral nephrolithiasis measuring up to 4 mm. 5. Cholelithiasis no acute cholecystitis. 6. Aortic Atherosclerosis (ICD10-I70.0) including four-vessel coronary calcification. Electronically Signed   By: Morgane  Naveau M.D.   On: 09/11/2023 17:48    Microbiology: No results found for this or any previous visit.  Labs: CBC: Recent Labs  Lab 09/11/23 1508 09/12/23 0429 09/13/23 0437  WBC 8.9 7.3 7.0  NEUTROABS  --   --  5.2  HGB 15.2 14.8 14.4  HCT 45.8 44.7 43.5  MCV 102.7* 101.1* 101.6*  PLT 183 167 171   Basic Metabolic Panel: Recent Labs  Lab 09/11/23 1508 09/12/23 0429 09/13/23 0437  NA 139 141 140  K 4.2 3.2* 3.6  CL 103 106 107  CO2 27 24 24   GLUCOSE 105* 83 99  BUN 27* 26* 28*  CREATININE 0.96 0.75 0.79  CALCIUM  9.3 9.0 9.2  MG  --   --  2.1  PHOS  --   --  2.5   Liver Function Tests: Recent Labs  Lab 09/11/23 1508 09/13/23 0437  AST 30 28  ALT 21 21  ALKPHOS 77 79  BILITOT 1.2 0.8  PROT 5.8* 5.7*  ALBUMIN 2.7* 2.8*   CBG: Recent Labs  Lab 09/12/23 1141 09/12/23 1540 09/12/23 2110 09/13/23 0756 09/13/23 1213  GLUCAP 91 76 98 104* 102*    Discharge time spent: 32 minutes.  Signed: Tali Coster, DO [facility] 09/13/2023

## 2023-09-13 NOTE — Plan of Care (Signed)

## 2023-09-13 NOTE — Progress Notes (Signed)
 Nathan Sink, MD [facility]   [address], [address] [place], [place] [address] Phone: [phone] Fax : [phone]   Subjective: The patient states that he has been tolerating food and ate not only met potatoes but chicken yesterday.  He is feeling well and was found to have multiple ulcers on his upper endoscopy yesterday.  The patient has been told that his gastrin levels will take time to come back and that he should avoid NSAIDs   Objective: Vital signs in last 24 hours: Vitals:   09/12/23 2111 09/13/23 0500 09/13/23 0536 09/13/23 0752  BP: 137/81  134/64 (!) 159/81  Pulse: 78  77 (!) 46  Resp: 16  18 16   Temp: 98.7 F (37.1 C)  98.7 F (37.1 C) 97.9 F (36.6 C)  TempSrc: Oral  Oral Oral  SpO2: 94%  97% 97%  Weight:  95.4 kg    Height:       Weight change: 1.959 kg  Intake/Output Summary (Last 24 hours) at 09/13/2023 1224 Last data filed at 09/13/2023 1023 Gross per 24 hour  Intake 990 ml  Output 0 ml  Net 990 ml     Exam: General: The patient sitting up in bed in no apparent distress  Lab Results: @LABTEST2 @ Micro Results: No results found for this or any previous visit (from the past 240 hours). Studies/Results: CT ABDOMEN PELVIS W CONTRAST Result Date: 09/11/2023 CLINICAL DATA:  Abdominal pain, acute, nonlocalized. complaints of epigastric pain that started last Thursday. Pt also states that they've lost their appetite a few weeks ago. EXAM: CT ABDOMEN AND PELVIS WITH CONTRAST TECHNIQUE: Multidetector CT imaging of the abdomen and pelvis was performed using the standard protocol following bolus administration of intravenous contrast. RADIATION DOSE REDUCTION: This exam was performed according to the departmental dose-optimization program which includes automated exposure control, adjustment of the mA and/or kV according to patient size and/or use of iterative reconstruction technique. CONTRAST:  OMNIPAQUE IOHEXOL 300 MG/ML  SOLN COMPARISON:  CT abdomen  pelvis 03/08/2016 FINDINGS: Lower chest: At least small volume pericardial effusion. Trace left pleural effusion. At least small volume hiatal hernia. Four-vessel coronary calcification. Partially visualized cardiac leads. Hepatobiliary: No focal liver abnormality. Calcified gallstone noted within the gallbladder lumen. No gallbladder wall thickening or pericholecystic fluid. No biliary dilatation. Pancreas: Diffusely atrophic. No focal lesion. Otherwise normal pancreatic contour. No surrounding inflammatory changes. No main pancreatic ductal dilatation. Spleen: Normal in size without focal abnormality. Adrenals/Urinary Tract: No adrenal nodule bilaterally. Bilateral kidneys enhance symmetrically. No hydronephrosis. No hydroureter. Bilateral nephrolithiasis measuring 4 mm on the right and 4 mm on the left. Fluid dense lesion of the left kidney likely represent simple renal cysts. Simple renal cysts, in the absence of clinically indicated signs/symptoms, require no independent follow-up. The urinary bladder is unremarkable. Stomach/Bowel: Stomach is within normal limits. No evidence of bowel wall thickening or dilatation. The appendix is not definitely identified with no inflammatory changes in the right lower quadrant to suggest acute appendicitis. Vascular/Lymphatic: No abdominal aorta or iliac aneurysm. Severe atherosclerotic plaque of the aorta and its branches. No abdominal, pelvic, or inguinal lymphadenopathy. Reproductive: The prostate is enlarged measuring up to 5.6 cm. Other: No intraperitoneal free fluid. No intraperitoneal free gas. No organized fluid collection. Musculoskeletal: No abdominal wall hernia or abnormality. No suspicious lytic or blastic osseous lesions. No acute displaced fracture. Multilevel severe degenerative changes of the spine. Mild retrolisthesis of L1 on L2, L2 on L3, L3 on L4. IMPRESSION: 1. At least  small volume pericardial effusion. 2. Trace left pleural effusion. 3. At least small  volume hiatal hernia in a patient status post Roux-en-Y gastric bypass. 4. Nonobstructive bilateral nephrolithiasis measuring up to 4 mm. 5. Cholelithiasis no acute cholecystitis. 6. Aortic Atherosclerosis (ICD10-I70.0) including four-vessel coronary calcification. Electronically Signed   By: Morgane  Naveau M.D.   On: 09/11/2023 17:48   Medications: I have reviewed the patient's current medications. Scheduled Meds:  atorvastatin  40 mg Oral Daily   buPROPion  150 mg Oral Q0600   feeding supplement  237 mL Oral BID BM   insulin aspart  0-5 Units Subcutaneous QHS   insulin aspart  0-9 Units Subcutaneous TID WC   pantoprazole  40 mg Oral Daily   potassium chloride  40 mEq Oral Once   rivaroxaban  20 mg Oral Q supper   sertraline  100 mg Oral Daily   Continuous Infusions: PRN Meds:.acetaminophen **OR** acetaminophen, hydrALAZINE, ondansetron **OR** ondansetron (ZOFRAN) IV, temazepam   Assessment: Principal Problem:   Gastrointestinal dysmotility Active Problems:   Essential hypertension   Depression   Hyperlipidemia   GERD (gastroesophageal reflux disease)   Insomnia   Diabetes mellitus type 2, noninsulin dependent (HCC)   Dysphagia   Peptic ulcer disease    Plan: This patient was found to have multiple ulcers post anastomosis with a history of a Roux-en-Y.  The patient has had gastrin level sent off and this will be reported at some later time.  The patient should be put on the PPI on discharge.  Nothing further to do from a GI point to be at this time.  Since he is tolerating p.o.'s well.   LOS: 2 days   Nathan Sink, MD.FACG 09/13/2023, 12:24 PM Pager [phone] 7am-5pm  Check AMION for 5pm -7am coverage and on weekends

## 2023-09-14 ENCOUNTER — Ambulatory Visit: Payer: Self-pay | Admitting: Gastroenterology

## 2023-09-14 LAB — GASTRIN: Gastrin: 243 pg/mL — ABNORMAL HIGH (ref 0–115)

## 2023-09-26 NOTE — Progress Notes (Signed)
 Query response: Chronic Diastolic Heart Failure

## 2024-01-29 ENCOUNTER — Emergency Department
Admission: EM | Admit: 2024-01-29 | Discharge: 2024-02-16 | Disposition: E | Attending: Emergency Medicine | Admitting: Emergency Medicine

## 2024-01-29 DIAGNOSIS — I469 Cardiac arrest, cause unspecified: Secondary | ICD-10-CM

## 2024-01-29 MED ORDER — EPINEPHRINE 1 MG/10ML IV SOSY
PREFILLED_SYRINGE | INTRAVENOUS | Status: AC | PRN
  Administered 2024-01-29 (×4): 1 mg via INTRAVENOUS

## 2024-01-29 MED ORDER — EPINEPHRINE 1 MG/10ML IV SOSY
PREFILLED_SYRINGE | INTRAVENOUS | Status: AC | PRN
  Administered 2024-01-29 (×6): 1 mg via INTRAVENOUS

## 2024-01-29 MED ORDER — SODIUM BICARBONATE 8.4 % IV SOLN
INTRAVENOUS | Status: AC | PRN
Start: 2024-01-29 — End: 2024-01-29
  Administered 2024-01-29 (×4): 50 meq via INTRAVENOUS

## 2024-01-29 MED ORDER — CALCIUM CHLORIDE 10 % IV SOLN
INTRAVENOUS | Status: AC | PRN
Start: 2024-01-29 — End: 2024-01-29
  Administered 2024-01-29 (×2): 1 g via INTRAVENOUS

## 2024-02-16 NOTE — Code Documentation (Signed)
Pulse check; PEA. Resumed CPR 

## 2024-02-16 NOTE — Progress Notes (Signed)
  Chaplain On-Call responded to a page from [employer], who reported CPR in progress for the patient.  Chaplain arrived and learned of the patient's death at 39.  Chaplain met the patient's wife [name], and co-worker Nathan [name] from the [employer].Nathan [name] was grieving outwardly, speaking about the patient's lively personality and how he brightened life for all who knew him. She spoke also about his health challenges with ongoing A-Fib and a pacemaker.  Conversation partners present were RN Duwaine Brochure, and Officer Pyron of the [employer] who knows the family well.  Chaplain provided spiritual and emotional support, offering encouragement to Nathan [name] through her faith.  Chaplain Bebe Ardean EMERSON Hershal., [facility]

## 2024-02-16 NOTE — Code Documentation (Addendum)
 Lucas placed at this time. Pulse check PEA

## 2024-02-16 NOTE — Code Documentation (Signed)
 Pulse check at this time. CPR stopped at this time.

## 2024-02-16 NOTE — ED Triage Notes (Addendum)
 Pt arrived to ED via [facility]. EMS was called out for difficulty breathing. Pt was in the floor when ems arrived. 2 rounds of CPR and one epi was given by EMS. Pt arrived in ED with airway in place. CPR started immediately upon arrival at 1129. Pt was in PEA at this time. 1 dose EPI given at 1131 via IO. 1 dose calcium at 1132. 1 Bicarb given at 1133.

## 2024-02-16 NOTE — Code Documentation (Signed)
 Wife at bedside at this time.

## 2024-02-16 NOTE — Code Documentation (Signed)
 Pulse check- PEA

## 2024-02-16 NOTE — Code Documentation (Signed)
 Time of death

## 2024-02-16 NOTE — ED Notes (Addendum)
 IO's and IV's removed as well as ET tube per MD. Pt bagged. Transport placed. Toe tag attached with tag on body bag. Pt had no belongings upon arrival to ED.

## 2024-02-16 NOTE — ED Provider Notes (Signed)
 [facility] Provider Note    Event Date/Time   First MD Initiated Contact with Patient 01/29/24 1154     (approximate)   History   CPR   HPI  Nathan [name] is a 73 y.o. male presents to the emergency department in cardiac arrest.  History is provided by EMS.  Stated that they were called out by the patient for shortness of breath and not feeling well.  When fire initially arrived there was a delay of getting to the patient since there was a dog bite.  States that fire had to go in through the window.  When they got there patient was altered and did not have a good pulse.  They initiated CPR and initial rhythm was PEA.  2 rounds of CPR and epi were given with ROSC.     Physical Exam   Triage Vital Signs: ED Triage Vitals  Encounter Vitals Group     BP      Girls Systolic BP Percentile      Girls Diastolic BP Percentile      Boys Systolic BP Percentile      Boys Diastolic BP Percentile      Pulse      Resp      Temp      Temp src      SpO2      Weight      Height      Head Circumference      Peak Flow      Pain Score      Pain Loc      Pain Education      Exclude from Growth Chart     Most recent vital signs: There were no vitals filed for this visit.  Physical Exam Constitutional:      Appearance: He is well-developed. He is ill-appearing.  HENT:     Head: Atraumatic.  Pulmonary:     Comments: Intubated, coarse breath sound Abdominal:     Tenderness: There is no abdominal tenderness.  Musculoskeletal:     Right lower leg: No edema.     Left lower leg: No edema.  Skin:    General: Skin is warm.     Capillary Refill: Capillary refill takes more than 3 seconds.  Neurological:     Comments: Nonresponsive     IMPRESSION / MDM / ASSESSMENT AND PLAN / ED COURSE  I reviewed the triage vital signs and the nursing notes.  Patient arrived to the emergency department with CPR.  On my evaluation patient moved over to the stretcher and  did not have a pulse.  CPR was initiated.  Multiple rounds of CPR.  Patient received epi, calcium and bicarb.  LABS (all labs ordered are listed, but only abnormal results are displayed) Labs interpreted as -    Labs Reviewed - No data to display   MDM  Patient given IV fluid bolus.  Multiple rounds of CPR and continued to be in PEA.  Bedside echocardiogram with no signs of cardiac activity, small pericardial effusion.  Patient's wife is at bedside.   multiple rounds of CPR continued to have no cardiac activity.  Patient continued to be in PEA.  Time of death 86.  Called patient's primary care provider Dr. Cleotilde and notified him of the death.  States that he had had a significant decline over the past couple of weeks.     PROCEDURES:  Critical Care performed: yes  CPR  Date/Time:  01/29/2024 3:50 PM  Performed by: Suzanne Kirsch, MD Authorized by: Suzanne Kirsch, MD  CPR Procedure Details:      Amount of time prior to administration of ACLS/BLS (minutes):  3   ACLS/BLS initiated by EMS: Yes     CPR/ACLS performed in the ED: Yes     Duration of CPR (minutes):  30   Outcome: Pt declared dead    CPR performed via ACLS guidelines under my direct supervision.  See RN documentation for details including defibrillator use, medications, doses and timing. .Critical Care  Performed by: Suzanne Kirsch, MD Authorized by: Suzanne Kirsch, MD   Critical care provider statement:    Critical care time (minutes):  50   Critical care was necessary to treat or prevent imminent or life-threatening deterioration of the following conditions:  Cardiac failure   Critical care was time spent personally by me on the following activities:  Development of treatment plan with patient or surrogate, discussions with consultants, evaluation of patient's response to treatment, examination of patient, ordering and review of laboratory studies, ordering and review of radiographic studies, ordering and  performing treatments and interventions, pulse oximetry, re-evaluation of patient's condition and review of old charts   Patient's presentation is most consistent with acute presentation with potential threat to life or bodily function.   MEDICATIONS ORDERED IN ED: Medications  EPINEPHrine (ADRENALIN) 1 MG/10ML injection (1 mg Intravenous Given 01/29/24 1147)  calcium chloride injection (1 g Intravenous Given 01/29/24 1132)  sodium bicarbonate injection (50 mEq Intravenous Given 01/29/24 1140)  EPINEPHrine (ADRENALIN) 1 MG/10ML injection (1 mg Intravenous Given 01/29/24 1145)    FINAL CLINICAL IMPRESSION(S) / ED DIAGNOSES   Final diagnoses:  Cardiac arrest [facility])     Rx / DC Orders   ED Discharge Orders     None        Note:  This document was prepared using Dragon voice recognition software and may include unintentional dictation errors.   Suzanne Kirsch, MD 01/29/24 [phone]

## 2024-02-16 DEATH — deceased
